# Patient Record
Sex: Female | Born: 1985 | Hispanic: No | Marital: Single | Smoking: Current every day smoker
Health system: Southern US, Community
[De-identification: ages and names within clinical notes are randomized; demographics above are authoritative.]

## PROBLEM LIST (undated history)

## (undated) DIAGNOSIS — K219 Gastro-esophageal reflux disease without esophagitis: Secondary | ICD-10-CM

## (undated) DIAGNOSIS — A048 Other specified bacterial intestinal infections: Secondary | ICD-10-CM

## (undated) DIAGNOSIS — K279 Peptic ulcer, site unspecified, unspecified as acute or chronic, without hemorrhage or perforation: Secondary | ICD-10-CM

## (undated) DIAGNOSIS — B9681 Helicobacter pylori [H. pylori] as the cause of diseases classified elsewhere: Secondary | ICD-10-CM

## (undated) HISTORY — DX: Gastro-esophageal reflux disease without esophagitis: K21.9

## (undated) HISTORY — PX: CHOLECYSTECTOMY: SHX55

---

## 2006-03-12 ENCOUNTER — Emergency Department (HOSPITAL_COMMUNITY): Admission: EM | Admit: 2006-03-12 | Discharge: 2006-03-12 | Payer: Self-pay | Admitting: Emergency Medicine

## 2006-04-23 ENCOUNTER — Emergency Department (HOSPITAL_COMMUNITY): Admission: EM | Admit: 2006-04-23 | Discharge: 2006-04-24 | Payer: Self-pay | Admitting: Emergency Medicine

## 2006-08-31 ENCOUNTER — Inpatient Hospital Stay (HOSPITAL_COMMUNITY): Admission: AD | Admit: 2006-08-31 | Discharge: 2006-08-31 | Payer: Self-pay | Admitting: Obstetrics & Gynecology

## 2006-11-18 ENCOUNTER — Inpatient Hospital Stay (HOSPITAL_COMMUNITY): Admission: AD | Admit: 2006-11-18 | Discharge: 2006-11-18 | Payer: Self-pay | Admitting: Obstetrics & Gynecology

## 2006-11-25 ENCOUNTER — Inpatient Hospital Stay (HOSPITAL_COMMUNITY): Admission: AD | Admit: 2006-11-25 | Discharge: 2006-11-25 | Payer: Self-pay | Admitting: Obstetrics & Gynecology

## 2006-12-05 ENCOUNTER — Ambulatory Visit: Payer: Self-pay | Admitting: Obstetrics and Gynecology

## 2006-12-05 ENCOUNTER — Inpatient Hospital Stay (HOSPITAL_COMMUNITY): Admission: AD | Admit: 2006-12-05 | Discharge: 2006-12-10 | Payer: Self-pay | Admitting: Obstetrics & Gynecology

## 2007-01-10 ENCOUNTER — Emergency Department (HOSPITAL_COMMUNITY): Admission: EM | Admit: 2007-01-10 | Discharge: 2007-01-11 | Payer: Self-pay | Admitting: Emergency Medicine

## 2007-02-06 ENCOUNTER — Emergency Department (HOSPITAL_COMMUNITY): Admission: EM | Admit: 2007-02-06 | Discharge: 2007-02-06 | Payer: Self-pay | Admitting: Emergency Medicine

## 2007-06-06 ENCOUNTER — Emergency Department (HOSPITAL_COMMUNITY): Admission: EM | Admit: 2007-06-06 | Discharge: 2007-06-06 | Payer: Self-pay | Admitting: Emergency Medicine

## 2007-06-14 ENCOUNTER — Encounter
Admission: RE | Admit: 2007-06-14 | Discharge: 2007-07-21 | Payer: Self-pay | Admitting: Physical Medicine and Rehabilitation

## 2007-07-01 ENCOUNTER — Emergency Department (HOSPITAL_COMMUNITY): Admission: EM | Admit: 2007-07-01 | Discharge: 2007-07-02 | Payer: Self-pay | Admitting: Emergency Medicine

## 2007-09-06 ENCOUNTER — Emergency Department (HOSPITAL_COMMUNITY): Admission: EM | Admit: 2007-09-06 | Discharge: 2007-09-07 | Payer: Self-pay | Admitting: Emergency Medicine

## 2007-09-22 ENCOUNTER — Encounter (INDEPENDENT_AMBULATORY_CARE_PROVIDER_SITE_OTHER): Payer: Self-pay | Admitting: General Surgery

## 2007-09-22 ENCOUNTER — Ambulatory Visit (HOSPITAL_COMMUNITY): Admission: RE | Admit: 2007-09-22 | Discharge: 2007-09-22 | Payer: Self-pay | Admitting: General Surgery

## 2007-10-06 ENCOUNTER — Encounter: Admission: RE | Admit: 2007-10-06 | Discharge: 2007-10-06 | Payer: Self-pay | Admitting: General Surgery

## 2007-11-05 ENCOUNTER — Emergency Department (HOSPITAL_COMMUNITY): Admission: EM | Admit: 2007-11-05 | Discharge: 2007-11-05 | Payer: Self-pay | Admitting: Emergency Medicine

## 2007-11-26 ENCOUNTER — Encounter: Admission: RE | Admit: 2007-11-26 | Discharge: 2007-11-26 | Payer: Self-pay | Admitting: Family Medicine

## 2008-02-19 ENCOUNTER — Emergency Department (HOSPITAL_COMMUNITY): Admission: EM | Admit: 2008-02-19 | Discharge: 2008-02-19 | Payer: Self-pay | Admitting: Emergency Medicine

## 2008-04-20 ENCOUNTER — Emergency Department (HOSPITAL_COMMUNITY): Admission: EM | Admit: 2008-04-20 | Discharge: 2008-04-20 | Payer: Self-pay | Admitting: Emergency Medicine

## 2008-04-29 ENCOUNTER — Emergency Department (HOSPITAL_COMMUNITY): Admission: EM | Admit: 2008-04-29 | Discharge: 2008-04-29 | Payer: Self-pay | Admitting: Emergency Medicine

## 2008-05-25 ENCOUNTER — Emergency Department (HOSPITAL_COMMUNITY): Admission: EM | Admit: 2008-05-25 | Discharge: 2008-05-25 | Payer: Self-pay | Admitting: Emergency Medicine

## 2008-05-31 ENCOUNTER — Emergency Department (HOSPITAL_COMMUNITY): Admission: EM | Admit: 2008-05-31 | Discharge: 2008-06-01 | Payer: Self-pay | Admitting: Emergency Medicine

## 2008-06-22 ENCOUNTER — Emergency Department (HOSPITAL_COMMUNITY): Admission: EM | Admit: 2008-06-22 | Discharge: 2008-06-22 | Payer: Self-pay | Admitting: Emergency Medicine

## 2008-11-14 ENCOUNTER — Emergency Department (HOSPITAL_COMMUNITY): Admission: EM | Admit: 2008-11-14 | Discharge: 2008-11-14 | Payer: Self-pay | Admitting: Emergency Medicine

## 2008-11-23 ENCOUNTER — Emergency Department (HOSPITAL_COMMUNITY): Admission: EM | Admit: 2008-11-23 | Discharge: 2008-11-23 | Payer: Self-pay | Admitting: Emergency Medicine

## 2009-05-18 ENCOUNTER — Emergency Department (HOSPITAL_COMMUNITY): Admission: EM | Admit: 2009-05-18 | Discharge: 2009-05-18 | Payer: Self-pay | Admitting: Emergency Medicine

## 2009-05-29 ENCOUNTER — Emergency Department (HOSPITAL_COMMUNITY): Admission: EM | Admit: 2009-05-29 | Discharge: 2009-05-29 | Payer: Self-pay | Admitting: Emergency Medicine

## 2009-11-18 ENCOUNTER — Emergency Department (HOSPITAL_COMMUNITY): Admission: EM | Admit: 2009-11-18 | Discharge: 2009-11-18 | Payer: Self-pay | Admitting: Emergency Medicine

## 2010-02-24 ENCOUNTER — Emergency Department (HOSPITAL_COMMUNITY): Admission: EM | Admit: 2010-02-24 | Discharge: 2010-02-24 | Payer: Self-pay | Admitting: Emergency Medicine

## 2010-09-26 ENCOUNTER — Emergency Department (HOSPITAL_COMMUNITY)
Admission: EM | Admit: 2010-09-26 | Discharge: 2010-09-26 | Disposition: A | Discharge status: Home / Self Care | Payer: Medicaid Other | Attending: Emergency Medicine | Admitting: Emergency Medicine

## 2010-09-26 ENCOUNTER — Emergency Department (HOSPITAL_COMMUNITY): Payer: Medicaid Other

## 2010-09-26 DIAGNOSIS — R0602 Shortness of breath: Secondary | ICD-10-CM | POA: Insufficient documentation

## 2010-09-26 DIAGNOSIS — R079 Chest pain, unspecified: Secondary | ICD-10-CM | POA: Insufficient documentation

## 2010-11-17 LAB — WET PREP, GENITAL
Clue Cells Wet Prep HPF POC: NONE SEEN
Trich, Wet Prep: NONE SEEN
Yeast Wet Prep HPF POC: NONE SEEN

## 2010-11-17 LAB — URINALYSIS, ROUTINE W REFLEX MICROSCOPIC
Bilirubin Urine: NEGATIVE
Ketones, ur: NEGATIVE mg/dL
Leukocytes, UA: NEGATIVE
Nitrite: NEGATIVE
Protein, ur: NEGATIVE mg/dL
Urobilinogen, UA: 0.2 mg/dL (ref 0.0–1.0)
pH: 5.5 (ref 5.0–8.0)

## 2010-11-17 LAB — URINE MICROSCOPIC-ADD ON

## 2010-11-17 LAB — GC/CHLAMYDIA PROBE AMP, GENITAL
Chlamydia, DNA Probe: NEGATIVE
GC Probe Amp, Genital: NEGATIVE

## 2011-01-07 NOTE — Consult Note (Signed)
NAMEGENEVA, BARRERO NO.:  [id number]   MEDICAL RECORD NO.:  [id number]          PATIENT TYPE:  EMS   LOCATION:  ED                           FACILITY:  [facility]   PHYSICIAN:  Lennie Muckle, MD      DATE OF BIRTH:  Jan 06, 1986   DATE OF CONSULTATION:  09/06/2007  DATE OF DISCHARGE:  09/07/2007                                 CONSULTATION   CHIEF COMPLAINT:  Abdominal  pain.   Ms. Dizon is a 25 year old female who I have seen in office due to  symptomatic cholelithiasis.  Apparently the past 24 hours she has had an  increase in abdominal pain and last night had nausea and vomiting.  She  states her pain continued today and she has not been able to keep  anything down.  She was scheduled for surgery at the end of the month.   PAST MEDICAL HISTORY:  None.   PAST SURGICAL HISTORY:  None.   SOCIAL HISTORY:  She smokes cigarettes.   ALLERGIES:  NO KNOWN DRUG ALLERGIES.   MEDICATIONS:  Takes no medications.   REVIEW OF SYSTEMS:  Twelve point system is negative other than the HPI.   PHYSICAL EXAMINATION:  GENERAL: She is a pleasant female lying on a  stretcher in no acute distress.  VITAL SIGNS: Her temperature is 98.2, blood pressure 125/91, pulse is  78.  ABDOMEN: Slightly tender to palpation right upper quadrant.  No  peritoneal signs are noted.   LABORATORY:  Reviewed. She does have some liver enzyme elevation of 110  and 121 of AST and ALT respectively. Alkaline phosphatase is normal at  80.  Bilirubin is 1.2. White count is mildly elevated at 15.2.  Urinalysis is essentially without problems.   ASSESSMENT AND PLAN:  Biliary colic.  I think Ms. Kendle is not in  emergent need for cholecystectomy at the present time.  Hopefully she  will be able to get her pain under control with IV narcotics and receive  IV fluids for rehydration.  She should be able to be discharged home  from the emergency room and will follow up with her surgery in the near  future.      Lennie Muckle, MD  Electronically Signed    ALA/MEDQ  D:  09/06/2007  T:  09/07/2007  Job:  [id number]

## 2011-01-07 NOTE — Op Note (Signed)
[name], [name] NO.:  [id number]   MEDICAL RECORD NO.:  [id number]          PATIENT TYPE:  AMB   LOCATION:  SDS                          FACILITY:  [facility]   PHYSICIAN:  Lennie Muckle, MD      DATE OF BIRTH:  Aug 16, 1986   DATE OF PROCEDURE:  09/22/2007  DATE OF DISCHARGE:  09/22/2007                               OPERATIVE REPORT   PREOPERATIVE DIAGNOSIS:  Symptomatic cholelithiasis.   POSTOPERATIVE DIAGNOSIS:  Symptomatic cholelithiasis.   PROCEDURE:  Laparoscopic cholecystectomy with intraoperative  cholangiogram.   SURGEON:  Amber L. Freida Busman, MD.   ASSISTANT:  Currie Paris, MD.   ANESTHESIA:  General endotracheal anesthesia.   FINDINGS:  Intrahepatic gallbladder, no common bile duct obstruction.   ESTIMATED BLOOD LOSS:  Less than 25 ml.   No immediate complications.  No drains were placed.   INDICATIONS FOR PROCEDURE:  [name] is a 25 year old female, who  had had multiple episodes of right upper quadrant pain.  Ultrasound  revealed cholelithiasis.  She did have a slight rise in her liver  enzymes previously and did not have any common hepatic duct dilation.  Informed consent was obtained for a laparoscopic cholecystectomy with  cholangiogram.   DETAILS OF PROCEDURE:  [name] was identified in the preoperative  holding suite.  She was then taken to the operating room, placed in a  supine position, administration of cefoxitin preoperatively was given.  Once in the operating room, she was placed in a supine position and  placed under general endotracheal anesthesia.  Her abdomen was prepped  and draped in the usual sterile fashion.  A timeout procedure indicating  the patient and the procedure was performed.  An incision was placed  just above the umbilicus, the fascia was grasped with a Kocher, a Veress  needle placed into the abdominal cavity to obtain a pneumoperitoneum.  Using a #11-mm trocar and the Optiview, the camera was  introduced into  the abdominal cavity.  There was no evidence of injury with the  placement of the Veress needle or the trocar.  A 5-mm trocar was placed  in the epigastric region.  Two 5-mm trocars were placed along the right  costochondral margin, and on visualization with the camera was then  placed in the gallbladder fossa.  There was a small, whitish  irregularity at the superior portion of the liver bed.  The fundus of  the gallbladder was retracted up towards the head of the patient.  The  infundibulum was grasped away from the liver bed.  Using the [place]  forceps, the peritoneum surrounding the gallbladder inferiorly was  dissected to expose the cystic duct.  The full exposure of the cystic  duct/cystic artery to obtain a critical view of the cystic duct artery  and liver bed was obtained.  The 5-mm trocar at the epigastric region  was then up-sized to an 11-mm trocar in order to place a 10-mm clip on  the distal portion of the cystic duct.  This was then partially  transected with laparoscopic scissors.  The cholangiogram catheter was  then placed into the cystic duct, and the cholangiogram was performed.  There did seem to be an air bubble at the distal portion of the common  bile duct, but no obstruction.  Contrast flowed freely into the duodenum  and into the right and left hepatic ducts.  Three clips were placed  proximally, and the cystic duct was then transected, two clips were  placed proximally on the cystic artery and one distally and was also  transected with laparoscopic scissors.  The peritoneum attachments to  the gallbladder were then taken with the electrocautery.  The fundus of  the gallbladder extended into the liver bed, which was the widest nodule  which was seen at the beginning of the case.  I was able to successfully  remove the gallbladder from the liver bed.  Hemostasis was maintained  with the electrocautery.  Surgicel was placed within the liver bed.   Upon the final inspection, there was no evidence of bleeding from the  liver bed.  The abdomen was irrigated with two liters of saline due a  small spillage of bile during the procedure.  The fluid was clear on  final irrigation.  The gallbladder was placed in an EndoCatch bag and  removed from the infraumbilical incision.  The fascia at the  supraumbilical incision was closed with a #0 Vicryl suture.  The skin  was closed with #4-0 Monocryl.  Trocars were removed and all ports were  closed with #4-0 Monocryl.  Steri-Strips were placed with the final  dressing.  The patient was then extubated and transported to the  postanesthesia care unit in stable condition.  She will follow up with  me in approximately two weeks time.      Lennie Muckle, MD  Electronically Signed     ALA/MEDQ  D:  09/22/2007  T:  09/22/2007  Job:  [id number]

## 2011-01-10 NOTE — Discharge Summary (Signed)
[name], [name] NO.:  [id number]   MEDICAL RECORD NO.:  [id number]          PATIENT TYPE:  INP   LOCATION:  9107                          FACILITY:  [facility]   PHYSICIAN:  Randye Lobo, M.D.   DATE OF BIRTH:  [birth year]   DATE OF ADMISSION:  12/05/2006  DATE OF DISCHARGE:  12/10/2006                               DISCHARGE SUMMARY   FINAL DIAGNOSIS:  1. Intrauterine pregnancy at term.  2. Labor.  3. Cephalopelvic disproportion.   POSTOPERATIVE DIAGNOSIS:  Endometritis.   PROCEDURE:  Primary low transverse cesarean section.  Surgeon Dr.  Ilda Mori.  Assistant Dr. Christin Bach.   COMPLICATIONS:  None.   HISTORY OF PRESENT ILLNESS:  This 25 year old G1 P0 presents at term in  early labor.  The patient's antepartum course had been uncomplicated up  until this last couple weeks.  The patient was noted to have an AFI in  the range of 6-7. Nonstress test was reactive and the patient's BPP was  8/8.  Otherwise her antepartum course had been uncomplicated.  She was  admitted at this point at term.  She had a dysfunctional latent phase of  labor and therefore a low dose IV Pitocin was begun.  The patient  steadily progressed to complete and complete. She pushed for an hour and  half with very little descent of the vertex. Cephalopelvic disproportion  was diagnosed at this point with arrest of the descent in second stage  labor and at this point the patient was taken to the operating room on  the day of December 06, 2006 by Dr. Ilda Mori where a primary low  transverse cesarean section was performed with the delivery of an 8  pounds 8 ounces female infant with Apgars of nine and nine. The baby was  in the occiput posterior position. Delivery went without complications.  There was some excessive bleeding in the lower uterine segment. The  lower uterine segment was closed in two layers with interlocking  sutures. The bleeding point were identified and closed  with figure-of-  eight sutures and vertical mattress suture until hemostasis was  obtained. The rest of the procedure without complication.  The patient's  postoperative course was complicated by some postoperative anemia and  postoperative fevers.  The patient was diagnosed with probable  endometritis. On postoperative day #3 was started on some IV Unasyn. The  patient's temperatures did resolve by postoperative day #4, she was  feeling much better and had no significant fevers over last 24 hours and  was felt ready for discharge.  She was sent home on a regular diet, told  to continue her vitamins and iron supplement twice daily, was given  Percocet one to two every 4-6 hours as needed for pain, was given a  prescription for Augmentin to take as directed for another week and was  to follow up in our office in 1 week to recheck. Of course to call with  any increased temperature, bleeding or pain.   LABS ON DISCHARGE:  The patient had a hemoglobin of 7.4, white blood  cell count of  19.2 and platelets of 354,000.      [name], P.A.-C.      Randye Lobo, M.D.  Electronically Signed    MB/MEDQ  D:  01/15/2007  T:  01/15/2007  Job:  [id number]

## 2011-01-10 NOTE — Op Note (Signed)
[name], REINERTSEN NO.:  [id number]   MEDICAL RECORD NO.:  [id number]          PATIENT TYPE:  INP   LOCATION:  9107                          FACILITY:  [facility]   PHYSICIAN:  Ilda Mori, M.D.   DATE OF BIRTH:  06-Jul-1986   DATE OF PROCEDURE:  12/06/2006  DATE OF DISCHARGE:                               OPERATIVE REPORT   PREOPERATIVE DIAGNOSIS:  Cephalopelvic disproportion.   POSTOPERATIVE DIAGNOSIS:  Cephalopelvic disproportion.   PROCEDURE:  Primary low transverse cesarean section.   SURGEON:  Ilda Mori, M.D.   ASSISTANT:  Tilda Burrow, M.D.   ANESTHESIA:  Epidural.   FINDINGS:  A female infant with Apgar scores of 9 and 9,  birth weight 8  pounds 8 ounces.  The tubes and ovaries appeared normal.  The baby was  in the occiput posterior position.   COMPLICATIONS:  Excessive bleeding from the lower uterine segment   INDICATIONS:  This is a 25 year old primigravid female who presented to  [facility] in early labor.  The patient's estimated fetal weight on  ultrasound, prior to admission, was 7 pounds 8 ounces.  The patient had  a long labor complicated by poor quality uterine contractions which did  not respond well to IV Pitocin or IV Inderal.  The patient did, however,  progress to complete dilatation.  She pushed for 1-1/2 hours with very  little descent the vertex.  The decision was made, at that point, to  proceed with cesarean section for arrest of decent in the second stage  of labor.   PROCEDURE:  The patient was taken to the operating room; and the  epidural anesthesia that had been used for labor was injected for  surgical anesthesia.  The abdomen was prepped and draped in a sterile fashion.  The bladder  had previously been catheterized.  A low-transverse incision was made  and carried down to the fascia which was entered transversely.  The  rectus muscles were divided from the overlying rectus sheath and divided  in the  midline.  The peritoneum was identified, entered sharply, and  extended bluntly with the lower uterine segment identified; an incision  was made, and carried down into the intrauterine cavity; and this  incision was extended bluntly.  The infant was then delivered without  difficulty; and the cord bloods were obtained.  The placenta was then  delivered with traction on the cord; and the uterus was bluntly  curettaged.  Due to heavy bleeding from the angles of the lower segment  incision, the uterus was delivered from the peritoneal cavity to aid in  exposure.  The lower segment was then closed in two layers of  interlocking Vicryl suture.  Bleeding points at the angles were then  identified and closed with a figure-of-eight sutures and vertical  mattress suture until hemostasis was obtained.  At this point the uterus  was then replaced into the peritoneal cavity.  The uterine incision was  dry.  The peritoneum was closed with running 3-0 Vicryl suture.  The  rectus muscle  was reapposed in the midline with interrupted 3-0  Vicryl suture.  The  fascia was closed with running #0 Vicryl suture.  Three subcutaneous  sutures were placed of 2-0 plain catgut and then the skin was closed  with staples.  The patient tolerated the procedure well, and left the  operating room in good condition.      Ilda Mori, M.D.  Electronically Signed     RK/MEDQ  D:  12/06/2006  T:  12/06/2006  Job:  [id number]

## 2011-05-15 LAB — DIFFERENTIAL
Basophils Absolute: 0.2 — ABNORMAL HIGH
Basophils Relative: 1
Eosinophils Absolute: 0
Eosinophils Relative: 0
Lymphocytes Relative: 19
Lymphs Abs: 2.8
Monocytes Absolute: 0.8
Monocytes Relative: 5
Neutro Abs: 11.4 — ABNORMAL HIGH
Neutrophils Relative %: 75

## 2011-05-15 LAB — COMPREHENSIVE METABOLIC PANEL
ALT: 121 — ABNORMAL HIGH
AST: 110 — ABNORMAL HIGH
Albumin: 4
CO2: 29
Chloride: 101
GFR calc Af Amer: >60
GFR calc non Af Amer: >60
Sodium: 136
Total Bilirubin: 1.2

## 2011-05-15 LAB — URINALYSIS, ROUTINE W REFLEX MICROSCOPIC
Bilirubin Urine: NEGATIVE
Ketones, ur: NEGATIVE
Nitrite: NEGATIVE
Protein, ur: 30 — AB
Specific Gravity, Urine: 1.025
Urobilinogen, UA: 0.2
pH: 8.5 — ABNORMAL HIGH

## 2011-05-15 LAB — CBC
HCT: 37.8
HCT: 38.1
Hemoglobin: 12.9
Hemoglobin: 12.7
MCHC: 34.1
MCHC: 33.2
MCV: 79.9
Platelets: 429 — ABNORMAL HIGH
Platelets: 376
RBC: 4.72
RDW: 14
RDW: 13.7
WBC: 15.2 — ABNORMAL HIGH

## 2011-05-15 LAB — COMPREHENSIVE METABOLIC PANEL WITH GFR
Alkaline Phosphatase: 80
BUN: 7
Calcium: 9.2
Creatinine, Ser: 0.76
Glucose, Bld: 107 — ABNORMAL HIGH
Potassium: 4.1
Total Protein: 7.9

## 2011-05-15 LAB — LIPASE, BLOOD: Lipase: 29

## 2011-05-15 LAB — URINE MICROSCOPIC-ADD ON

## 2011-05-15 LAB — HEPATIC FUNCTION PANEL
AST: 16
Bilirubin, Direct: <0.1
Total Bilirubin: 0.4

## 2011-05-15 LAB — PREGNANCY, URINE: Preg Test, Ur: NEGATIVE

## 2011-05-16 ENCOUNTER — Emergency Department (HOSPITAL_COMMUNITY)
Admission: EM | Admit: 2011-05-16 | Discharge: 2011-05-16 | Disposition: A | Discharge status: Home / Self Care | Payer: Medicaid Other | Attending: Emergency Medicine | Admitting: Emergency Medicine

## 2011-05-16 DIAGNOSIS — R51 Headache: Secondary | ICD-10-CM | POA: Insufficient documentation

## 2011-05-16 DIAGNOSIS — R112 Nausea with vomiting, unspecified: Secondary | ICD-10-CM | POA: Insufficient documentation

## 2011-05-19 LAB — URINALYSIS, ROUTINE W REFLEX MICROSCOPIC
Nitrite: NEGATIVE
Protein, ur: NEGATIVE
Specific Gravity, Urine: 1.024
Urobilinogen, UA: 0.2

## 2011-05-19 LAB — POCT PREGNANCY, URINE
Operator id: 102851
Preg Test, Ur: NEGATIVE

## 2011-05-22 LAB — BASIC METABOLIC PANEL
BUN: 8
Calcium: 8.8
Creatinine, Ser: 0.61
GFR calc non Af Amer: >60
Glucose, Bld: 100 — ABNORMAL HIGH
Potassium: 3.9

## 2011-05-22 LAB — CBC
HCT: 37.1
Platelets: 293
RDW: 14.3
WBC: 8.4

## 2011-05-22 LAB — DIFFERENTIAL
Basophils Absolute: 0.1
Eosinophils Relative: 0
Lymphocytes Relative: 14
Neutrophils Relative %: 79 — ABNORMAL HIGH

## 2011-05-26 LAB — URINALYSIS, ROUTINE W REFLEX MICROSCOPIC
Ketones, ur: NEGATIVE
Nitrite: NEGATIVE
Protein, ur: NEGATIVE
pH: 6

## 2011-05-26 LAB — LIPASE, BLOOD: Lipase: 23

## 2011-05-26 LAB — COMPREHENSIVE METABOLIC PANEL
Albumin: 4.2
BUN: 10
Calcium: 9.7
Glucose, Bld: 99
Sodium: 141
Total Protein: 7.5

## 2011-05-26 LAB — PREGNANCY, URINE: Preg Test, Ur: NEGATIVE

## 2011-05-26 LAB — D-DIMER, QUANTITATIVE: D-Dimer, Quant: 0.29

## 2011-06-03 LAB — DIFFERENTIAL
Eosinophils Absolute: 0
Lymphs Abs: 1.6
Monocytes Relative: 5
Neutrophils Relative %: 85 — ABNORMAL HIGH

## 2011-06-03 LAB — URINALYSIS, ROUTINE W REFLEX MICROSCOPIC
Nitrite: NEGATIVE
Specific Gravity, Urine: 1.035 — ABNORMAL HIGH
Urobilinogen, UA: 1

## 2011-06-03 LAB — CBC
Hemoglobin: 12.6
MCHC: 33.4
Platelets: 457 — ABNORMAL HIGH
RDW: 14.8 — ABNORMAL HIGH

## 2011-06-03 LAB — URINE MICROSCOPIC-ADD ON

## 2011-06-03 LAB — COMPREHENSIVE METABOLIC PANEL
ALT: 161 — ABNORMAL HIGH
Calcium: 9.6
GFR calc Af Amer: >60
Glucose, Bld: 118 — ABNORMAL HIGH
Sodium: 139
Total Protein: 7.7

## 2011-06-05 ENCOUNTER — Emergency Department (HOSPITAL_COMMUNITY): Payer: Medicaid Other

## 2011-06-05 ENCOUNTER — Emergency Department (HOSPITAL_COMMUNITY)
Admission: EM | Admit: 2011-06-05 | Discharge: 2011-06-06 | Disposition: A | Discharge status: Home / Self Care | Payer: Medicaid Other | Attending: Emergency Medicine | Admitting: Emergency Medicine

## 2011-06-05 DIAGNOSIS — A499 Bacterial infection, unspecified: Secondary | ICD-10-CM | POA: Insufficient documentation

## 2011-06-05 DIAGNOSIS — N760 Acute vaginitis: Secondary | ICD-10-CM | POA: Insufficient documentation

## 2011-06-05 DIAGNOSIS — N898 Other specified noninflammatory disorders of vagina: Secondary | ICD-10-CM | POA: Insufficient documentation

## 2011-06-05 DIAGNOSIS — B9689 Other specified bacterial agents as the cause of diseases classified elsewhere: Secondary | ICD-10-CM | POA: Insufficient documentation

## 2011-06-05 LAB — URINALYSIS, ROUTINE W REFLEX MICROSCOPIC
Glucose, UA: NEGATIVE
Ketones, ur: NEGATIVE
Nitrite: NEGATIVE
Specific Gravity, Urine: 1.021 (ref 1.005–1.030)
Urobilinogen, UA: 0.2 mg/dL (ref 0.0–1.0)
pH: 7

## 2011-06-05 LAB — POCT PREGNANCY, URINE: Preg Test, Ur: NEGATIVE

## 2011-06-05 LAB — WET PREP, GENITAL: Yeast Wet Prep HPF POC: NONE SEEN

## 2011-06-05 LAB — URINE MICROSCOPIC-ADD ON

## 2011-06-06 LAB — RPR: RPR Ser Ql: NONREACTIVE

## 2011-07-11 ENCOUNTER — Emergency Department (HOSPITAL_COMMUNITY)
Admission: EM | Admit: 2011-07-11 | Discharge: 2011-07-11 | Disposition: A | Discharge status: Home / Self Care | Payer: Medicaid Other | Source: Home / Self Care | Attending: Emergency Medicine | Admitting: Emergency Medicine

## 2011-07-11 DIAGNOSIS — R6889 Other general symptoms and signs: Secondary | ICD-10-CM

## 2011-07-11 MED ORDER — FLUTICASONE PROPIONATE 50 MCG/ACT NA SUSP: 2.0000 | Freq: Every day | NASAL | Status: DC

## 2011-07-11 MED ORDER — PSEUDOEPHEDRINE-GUAIFENESIN ER 120-1200 MG PO TB12: 1.0000 | ORAL_TABLET | Freq: Two times a day (BID) | ORAL | Status: DC

## 2011-07-11 MED ORDER — IBUPROFEN 600 MG PO TABS: 600.0000 mg | ORAL_TABLET | Freq: Four times a day (QID) | ORAL | Status: AC | PRN

## 2011-07-11 NOTE — ED Provider Notes (Signed)
History     CSN: [id number] Arrival date & time: 07/11/2011 11:39 AM   First MD Initiated Contact with Patient 07/11/11 1007      Chief Complaint  Patient presents with  . Cough    (Consider location/radiation/quality/duration/timing/severity/associated sxs/prior treatment) HPI Comments: Nasal congestion, nasal d/c, bodyaches, malaise, decreased appetite, rhinorrhea, postnasal drip, ST, nonproductive cough x 2 days. No hemoptysis, fevers, CP SOB, wheezing, abd pain. N/V, diarrhea.   Patient is a 25 y.o. female presenting with cough. The history is provided by the patient.  Cough This is a new problem. The current episode started 2 days ago. The problem occurs constantly. The cough is non-productive. There has been no fever. Associated symptoms include ear congestion, rhinorrhea, sore throat and myalgias. Pertinent negatives include no chest pain, no chills, no sweats, no ear pain, no headaches, no shortness of breath, no wheezing and no eye redness. Treatments tried: otc cold/flu medication. The treatment provided mild relief. She is a smoker. Her past medical history does not include bronchitis, pneumonia or asthma.    History reviewed. No pertinent past medical history.  Past Surgical History  Procedure Date  . Cholecystectomy   . Cesarean section     History reviewed. No pertinent family history.  History  Substance Use Topics  . Smoking status: Current Everyday Smoker -- 0.5 packs/day    Types: Cigarettes  . Smokeless tobacco: Not on file  . Alcohol Use: Yes    OB History    Grav Para Term Preterm Abortions TAB SAB Ect Mult Living                  Review of Systems  Constitutional: Negative for fever and chills.  HENT: Positive for sore throat, rhinorrhea and postnasal drip. Negative for ear pain, trouble swallowing and voice change.   Eyes: Negative for redness.  Respiratory: Positive for cough. Negative for chest tightness, shortness of breath and wheezing.     Cardiovascular: Negative for chest pain.  Gastrointestinal: Negative for nausea, vomiting, abdominal pain and diarrhea.  Musculoskeletal: Positive for myalgias.  Skin: Negative for rash.  Neurological: Negative for headaches.    Allergies  Review of patient's allergies indicates no known allergies.  Home Medications   Current Outpatient Rx  Name Route Sig Dispense Refill  . FLUTICASONE PROPIONATE 50 MCG/ACT NA SUSP Nasal Place 2 sprays into the nose daily. 16 g 2  . IBUPROFEN 600 MG PO TABS Oral Take 1 tablet (600 mg total) by mouth every 6 (six) hours as needed for pain. 30 tablet 0  . PSEUDOEPHEDRINE-GUAIFENESIN [id number] MG PO TB12 Oral Take 1 tablet by mouth 2 (two) times daily. 20 each 0    BP 134/75  Pulse 71  Temp(Src) 98.2 F (36.8 C) (Oral)  Resp 16  SpO2 100%  LMP 06/20/2011  Physical Exam  Nursing note and vitals reviewed. Constitutional: She is oriented to person, place, and time. She appears well-developed and well-nourished. No distress.  HENT:  Head: Normocephalic and atraumatic.  Right Ear: Ear canal normal. No tenderness. Tympanic membrane is bulging. Tympanic membrane is not erythematous. A middle ear effusion is present.  Left Ear: Ear canal normal. Tympanic membrane is retracted.  Nose: Mucosal edema and rhinorrhea present. Right sinus exhibits no maxillary sinus tenderness and no frontal sinus tenderness. Left sinus exhibits no maxillary sinus tenderness and no frontal sinus tenderness.  Mouth/Throat: Uvula is midline and mucous membranes are normal.       Serous effusion R ear  Eyes:  EOM are normal. Pupils are equal, round, and reactive to light.  Neck: Normal range of motion.  Cardiovascular: Normal rate, regular rhythm, normal heart sounds and intact distal pulses.   Pulmonary/Chest: Effort normal and breath sounds normal. No respiratory distress. She has no wheezes. She has no rales.  Abdominal: She exhibits no distension.  Musculoskeletal: Normal  range of motion. She exhibits no edema and no tenderness.  Lymphadenopathy:    She has no cervical adenopathy.  Neurological: She is alert and oriented to person, place, and time.  Skin: Skin is warm and dry. No rash noted.  Psychiatric: She has a normal mood and affect. Her behavior is normal. Judgment and thought content normal.    ED Course  Procedures (including critical care time)  Labs Reviewed - No data to display No results found.   1. Flu-like symptoms       MDM  Pt appears to be in NAD. VSS. Pt non-toxic appearing. No evidence of pharyngitis or OM. No evidence of neck stiffness or other sx to support meningitis. No evidence of dehydration. Abd S/NT/ND without peritoneal sx. Doubt intraabdominal process. No evidence of PNA or UTI. Pt able to tolerate PO. Pt with viral syndrome, probable flu. Will treat symptomatically with OTC medications and have pt f/u with PCP PRN  Luiz Blare, MD 07/11/11 1311

## 2011-07-11 NOTE — ED Notes (Signed)
C/o has been having a cough w congestion for past 2 days, w general body aches, yellow nasal secretions, feels cold all the time, "feels like the flu"

## 2011-09-21 ENCOUNTER — Encounter (HOSPITAL_COMMUNITY): Payer: Self-pay | Admitting: Emergency Medicine

## 2011-09-21 ENCOUNTER — Emergency Department (HOSPITAL_COMMUNITY)
Admission: EM | Admit: 2011-09-21 | Discharge: 2011-09-21 | Disposition: A | Discharge status: Home / Self Care | Payer: Medicaid Other | Attending: Emergency Medicine | Admitting: Emergency Medicine

## 2011-09-21 ENCOUNTER — Emergency Department (HOSPITAL_COMMUNITY): Payer: Medicaid Other

## 2011-09-21 DIAGNOSIS — F17200 Nicotine dependence, unspecified, uncomplicated: Secondary | ICD-10-CM | POA: Insufficient documentation

## 2011-09-21 DIAGNOSIS — W1809XA Striking against other object with subsequent fall, initial encounter: Secondary | ICD-10-CM | POA: Insufficient documentation

## 2011-09-21 DIAGNOSIS — R079 Chest pain, unspecified: Secondary | ICD-10-CM | POA: Insufficient documentation

## 2011-09-21 DIAGNOSIS — T148XXA Other injury of unspecified body region, initial encounter: Secondary | ICD-10-CM

## 2011-09-21 DIAGNOSIS — M549 Dorsalgia, unspecified: Secondary | ICD-10-CM | POA: Insufficient documentation

## 2011-09-21 DIAGNOSIS — R0602 Shortness of breath: Secondary | ICD-10-CM | POA: Insufficient documentation

## 2011-09-21 MED ORDER — HYDROCODONE-ACETAMINOPHEN 5-325 MG PO TABS
1.0000 | ORAL_TABLET | Freq: Once | ORAL | Status: AC
  Administered 2011-09-21: 1 via ORAL
  Filled 2011-09-21: qty 1

## 2011-09-21 MED ORDER — HYDROCODONE-ACETAMINOPHEN 5-325 MG PO TABS: 1.0000 | ORAL_TABLET | ORAL | Status: AC | PRN

## 2011-09-21 NOTE — ED Notes (Signed)
WUJ:[id number]<JY> Expected date:09/21/11<BR> Expected time: 7:55 PM<BR> Means of arrival:Ambulance<BR> Comments:<BR> EMS [facility] GC, 23 yof back pain, anxiety
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
## 2011-09-21 NOTE — ED Notes (Signed)
Pt. Injured left side of back while putting wood in a stove.  Went to work today and did some lifting then developed chest pain, facial tingling, left leg pain.  Pt. Is hyperventilating.

## 2011-09-22 NOTE — ED Provider Notes (Signed)
Medical screening examination/treatment/procedure(s) were performed by non-physician practitioner and as supervising physician I was immediately available for consultation/collaboration.  Cyndra Numbers, MD 09/22/11 1239

## 2012-04-22 DIAGNOSIS — Z720 Tobacco use: Secondary | ICD-10-CM | POA: Insufficient documentation

## 2012-05-19 ENCOUNTER — Emergency Department (HOSPITAL_COMMUNITY): Payer: Medicaid Other

## 2012-05-19 ENCOUNTER — Emergency Department (HOSPITAL_COMMUNITY)
Admission: EM | Admit: 2012-05-19 | Discharge: 2012-05-20 | Disposition: A | Discharge status: Home / Self Care | Payer: Medicaid Other | Attending: Emergency Medicine | Admitting: Emergency Medicine

## 2012-05-19 ENCOUNTER — Encounter (HOSPITAL_COMMUNITY): Payer: Self-pay

## 2012-05-19 DIAGNOSIS — K2970 Gastritis, unspecified, without bleeding: Secondary | ICD-10-CM

## 2012-05-19 DIAGNOSIS — F17200 Nicotine dependence, unspecified, uncomplicated: Secondary | ICD-10-CM | POA: Insufficient documentation

## 2012-05-19 DIAGNOSIS — K2990 Gastroduodenitis, unspecified, without bleeding: Secondary | ICD-10-CM | POA: Insufficient documentation

## 2012-05-19 HISTORY — DX: Other specified bacterial intestinal infections: A04.8

## 2012-05-19 HISTORY — DX: Helicobacter pylori (H. pylori) as the cause of diseases classified elsewhere: B96.81

## 2012-05-19 HISTORY — DX: Peptic ulcer, site unspecified, unspecified as acute or chronic, without hemorrhage or perforation: K27.9

## 2012-05-19 LAB — COMPREHENSIVE METABOLIC PANEL
ALT: 10 U/L (ref 0–35)
AST: 13 U/L (ref 0–37)
Albumin: 4 g/dL (ref 3.5–5.2)
Calcium: 9.2 mg/dL (ref 8.4–10.5)
Creatinine, Ser: 0.8 mg/dL (ref 0.50–1.10)
GFR calc non Af Amer: >90 mL/min (ref >90)
Sodium: 136 mEq/L (ref 135–145)
Total Protein: 7.1 g/dL (ref 6.0–8.3)

## 2012-05-19 LAB — URINE MICROSCOPIC-ADD ON

## 2012-05-19 LAB — CBC WITH DIFFERENTIAL/PLATELET
Basophils Absolute: 0 10*3/uL (ref 0.0–0.1)
Basophils Relative: 0 % (ref 0–1)
Eosinophils Absolute: 0.1 10*3/uL (ref 0.0–0.7)
Eosinophils Relative: 1 % (ref 0–5)
HCT: 37.8 % (ref 36.0–46.0)
Lymphocytes Relative: 34 % (ref 12–46)
MCHC: 34.4 g/dL (ref 30.0–36.0)
MCV: 81.8 fL (ref 78.0–100.0)
Monocytes Absolute: 1 10*3/uL (ref 0.1–1.0)
Platelets: 375 10*3/uL (ref 150–400)
RDW: 13.3 % (ref 11.5–15.5)
WBC: 11.8 10*3/uL — ABNORMAL HIGH (ref 4.0–10.5)

## 2012-05-19 LAB — URINALYSIS, ROUTINE W REFLEX MICROSCOPIC
Bilirubin Urine: NEGATIVE
Glucose, UA: NEGATIVE mg/dL
Specific Gravity, Urine: 1.023 (ref 1.005–1.030)
Urobilinogen, UA: 1 mg/dL (ref 0.0–1.0)
pH: 6.5 (ref 5.0–8.0)

## 2012-05-19 LAB — PREGNANCY, URINE: Preg Test, Ur: NEGATIVE

## 2012-05-19 MED ORDER — METOCLOPRAMIDE HCL 10 MG PO TABS: 10.0000 mg | ORAL_TABLET | Freq: Four times a day (QID) | ORAL | Status: DC

## 2012-05-19 MED ORDER — METOCLOPRAMIDE HCL 5 MG/ML IJ SOLN
10.0000 mg | Freq: Once | INTRAMUSCULAR | Status: AC
  Administered 2012-05-19: 10 mg via INTRAVENOUS
  Filled 2012-05-19: qty 2

## 2012-05-19 MED ORDER — HYDROMORPHONE HCL PF 1 MG/ML IJ SOLN
1.0000 mg | Freq: Once | INTRAMUSCULAR | Status: AC
  Administered 2012-05-19: 1 mg via INTRAVENOUS
  Filled 2012-05-19: qty 1

## 2012-05-19 MED ORDER — PANTOPRAZOLE SODIUM 40 MG IV SOLR
40.0000 mg | Freq: Once | INTRAVENOUS | Status: AC
  Administered 2012-05-19: 40 mg via INTRAVENOUS
  Filled 2012-05-19: qty 40

## 2012-05-19 MED ORDER — GI COCKTAIL ~~LOC~~
30.0000 mL | Freq: Once | ORAL | Status: AC
  Administered 2012-05-19: 30 mL via ORAL
  Filled 2012-05-19: qty 30

## 2012-05-19 MED ORDER — MORPHINE SULFATE 4 MG/ML IJ SOLN
4.0000 mg | Freq: Once | INTRAMUSCULAR | Status: AC
  Administered 2012-05-19: 4 mg via INTRAVENOUS
  Filled 2012-05-19: qty 1

## 2012-05-19 MED ORDER — SODIUM CHLORIDE 0.9 % IV BOLUS (SEPSIS)
1000.0000 mL | Freq: Once | INTRAVENOUS | Status: AC
  Administered 2012-05-19: 1000 mL via INTRAVENOUS

## 2012-05-19 MED ORDER — ONDANSETRON HCL 4 MG/2ML IJ SOLN
4.0000 mg | Freq: Once | INTRAMUSCULAR | Status: AC
  Administered 2012-05-19: 4 mg via INTRAVENOUS
  Filled 2012-05-19: qty 2

## 2012-05-19 MED ORDER — OXYCODONE-ACETAMINOPHEN 5-325 MG PO TABS: 1.0000 | ORAL_TABLET | ORAL | Status: DC | PRN

## 2012-05-19 NOTE — ED Provider Notes (Signed)
History     CSN: [id number]  Arrival date & time 05/19/12  4098   First MD Initiated Contact with Patient 05/19/12 2006      Chief Complaint  Patient presents with  . Chest Pain  . Abdominal Pain    (Consider location/radiation/quality/duration/timing/severity/associated sxs/prior treatment) HPI History provided by pt.   Pt reports that she was diagnosed with h.pylori and probably peptic ulcer by her PCP last week, when she presented w/ 2 days of constant and severe epigastric pain and nausea, as well as blood in her stool.  She was started on carafate, a PPI and promethazine, none of which have improved her sx.  Has an endoscopy scheduled for Monday.  Comes to ED today because now has vomiting and diarrhea and her epigastric pain has worsened.   Past abd surgeries include cholecystectomy.  Developed chest pain yesterday as well and it does not seem to be associated w/ her abd pain.  Started as pressure on the right side that started at rest and lasted for approx 45 min yesterday, and then returned at rest today, now spread across and described more as a tightness, and lasted for approx the same amount of time.  Tightness in center of chest is aggravated by deep inspiration.  Pain is not aggravated by position, movement or eating.  Denies fever, cough, SOB, LE pain/edema.  No RF for PE.  Smokes cigarettes but otherwise no RF for ACS.    Past Medical History  Diagnosis Date  . H pylori ulcer   . H. pylori infection     Past Surgical History  Procedure Date  . Cholecystectomy   . Cesarean section     No family history on file.  History  Substance Use Topics  . Smoking status: Current Every Day Smoker -- 0.5 packs/day    Types: Cigarettes  . Smokeless tobacco: Not on file  . Alcohol Use: Yes    OB History    Grav Para Term Preterm Abortions TAB SAB Ect Mult Living                  Review of Systems  All other systems reviewed and are negative.    Allergies  Review of  patient's allergies indicates no known allergies.  Home Medications   Current Outpatient Rx  Name Route Sig Dispense Refill  . AMOXICILL-CLARITHRO-LANSOPRAZ PO MISC Oral Take by mouth 2 (two) times daily. Follow package directions.    Marland Kitchen VITAMIN D 1000 UNITS PO TABS Oral Take 1,000 Units by mouth daily.    Marland Kitchen OMEPRAZOLE 20 MG PO CPDR Oral Take 20 mg by mouth daily.    Marland Kitchen PROMETHAZINE HCL 25 MG PO TABS Oral Take 25 mg by mouth every 6 (six) hours as needed. Nausea    . SUCRALFATE 1 GM/10ML PO SUSP Oral Take 1 g by mouth 4 (four) times daily.      BP 127/61  Pulse 75  Temp 98.6 F (37 C) (Oral)  Resp 13  SpO2 98%  LMP 05/14/2012  Physical Exam  Nursing note and vitals reviewed. Constitutional: She is oriented to person, place, and time. She appears well-developed and well-nourished. No distress.  HENT:  Head: Normocephalic and atraumatic.  Eyes:       Normal appearance  Neck: Normal range of motion.  Cardiovascular: Normal rate, regular rhythm and intact distal pulses.   Pulmonary/Chest: Effort normal and breath sounds normal. No respiratory distress.       Mild tenderness diffuse chest.  Pleuritic pain reported in center of chest.   Abdominal: Soft. Bowel sounds are normal. She exhibits no distension. There is no guarding.       Tenderness isolated to epigastrium  Genitourinary:       No CVA ttp  Musculoskeletal: Normal range of motion.       No peripheral edema or calf tenderness  Neurological: She is alert and oriented to person, place, and time.  Skin: Skin is warm and dry. No rash noted.  Psychiatric: She has a normal mood and affect. Her behavior is normal.    ED Course  Procedures (including critical care time)   Date: 05/19/2012  Rate: 69  Rhythm: normal sinus rhythm  QRS Axis: normal  Intervals: normal  ST/T Wave abnormalities: normal  Conduction Disutrbances:none  Narrative Interpretation:   Old EKG Reviewed: unchanged   Labs Reviewed  CBC WITH  DIFFERENTIAL - Abnormal; Notable for the following:    WBC 11.8 (*)     All other components within normal limits  URINALYSIS, ROUTINE W REFLEX MICROSCOPIC - Abnormal; Notable for the following:    Hgb urine dipstick LARGE (*)     Leukocytes, UA SMALL (*)     All other components within normal limits  URINE MICROSCOPIC-ADD ON - Abnormal; Notable for the following:    Squamous Epithelial / LPF FEW (*)     Bacteria, UA FEW (*)     All other components within normal limits  COMPREHENSIVE METABOLIC PANEL  PREGNANCY, URINE  LIPASE, BLOOD   Dg Chest 2 View  05/19/2012  *RADIOLOGY REPORT*  Clinical Data: 26 year old female with chest pain and shortness of breath.  CHEST - 2 VIEW  Comparison: 09/21/2011 and earlier.  Findings: Normal lung volumes.  Mild elevation right hemidiaphragm. Normal cardiac size and mediastinal contours.  Visualized tracheal air column is within normal limits.  The lungs are clear.  No pneumothorax or effusion. No acute osseous abnormality identified. Right upper quadrant surgical clips.  IMPRESSION: Negative, no acute cardiopulmonary abnormality.   Original Report Authenticated By: Harley Hallmark, M.D.      1. Gastritis       MDM  26yo F, recently diagnosed w/ PUD presents w/ worsening epigastric pain and N/V, refractory to PPI, carafate and promethazine, as well as new onset chest pain.  On exam, afebrile, VS w/in nml range, epigastric and diffuse chest tenderness.  No exam findings consistent with or RF for PE.  Very low risk for and poor story for ACS and EKG non-ischemic.  CXR as well as abd labs pending.  Pt to receive IV NS bolus, protonix, morphine and zofran.  Will reassess shortly.  8:57 PM   U/A positive for blood but patient has her period.  Labs otherwise unremarkable and CXR neg.  Pt reports that chest pain improved but pain in abd has worsened and she feels like she is going to throw up.  She is holding her abdomen and rocking in the bed. Will try a GI  cocktail, 1mg  dilaudid and 10mg  reglan and reassess shortly.  Based on history, exam and lab-findings, pt likely has gastritis/PUD w/ radiation of pain into chest or acid reflux.  10:04 PM   Pain and nausea improved and pt reports that she feels like she could eat now.  Passed po challenge.  D/c'd home w/ reglan to try in place of her promethazine, as well as percocet.  She will f/u with GI on Monday but return to ER if sx worsen in  meantime.  11:30 PM      Otilio Miu, [place] 05/19/12 [phone]

## 2012-05-19 NOTE — ED Notes (Signed)
Pt states dx with stomach ulcer, stomach infection and was positive for blood in stool last Wednesday and today at PCP been on lots of meds for it. States no better and now today having constant rt sided chest pain

## 2012-05-21 NOTE — ED Provider Notes (Signed)
Medical screening examination/treatment/procedure(s) were performed by non-physician practitioner and as supervising physician I was immediately available for consultation/collaboration.  Ivannia Willhelm, MD 05/21/12 1208 

## 2012-11-09 ENCOUNTER — Emergency Department (HOSPITAL_COMMUNITY)
Admission: EM | Admit: 2012-11-09 | Discharge: 2012-11-09 | Disposition: A | Discharge status: Home / Self Care | Payer: Medicaid Other | Attending: Emergency Medicine | Admitting: Emergency Medicine

## 2012-11-09 ENCOUNTER — Encounter (HOSPITAL_COMMUNITY): Payer: Self-pay | Admitting: *Deleted

## 2012-11-09 ENCOUNTER — Emergency Department (HOSPITAL_COMMUNITY): Payer: Medicaid Other

## 2012-11-09 DIAGNOSIS — Z8711 Personal history of peptic ulcer disease: Secondary | ICD-10-CM | POA: Insufficient documentation

## 2012-11-09 DIAGNOSIS — Z9089 Acquired absence of other organs: Secondary | ICD-10-CM | POA: Insufficient documentation

## 2012-11-09 DIAGNOSIS — F17200 Nicotine dependence, unspecified, uncomplicated: Secondary | ICD-10-CM | POA: Insufficient documentation

## 2012-11-09 DIAGNOSIS — N898 Other specified noninflammatory disorders of vagina: Secondary | ICD-10-CM | POA: Insufficient documentation

## 2012-11-09 DIAGNOSIS — Z8619 Personal history of other infectious and parasitic diseases: Secondary | ICD-10-CM | POA: Insufficient documentation

## 2012-11-09 DIAGNOSIS — R109 Unspecified abdominal pain: Secondary | ICD-10-CM | POA: Insufficient documentation

## 2012-11-09 DIAGNOSIS — Z3202 Encounter for pregnancy test, result negative: Secondary | ICD-10-CM | POA: Insufficient documentation

## 2012-11-09 DIAGNOSIS — M25559 Pain in unspecified hip: Secondary | ICD-10-CM | POA: Insufficient documentation

## 2012-11-09 LAB — COMPREHENSIVE METABOLIC PANEL
ALT: 17 U/L (ref 0–35)
Alkaline Phosphatase: 51 U/L (ref 39–117)
CO2: 25 mEq/L (ref 19–32)
Chloride: 105 mEq/L (ref 96–112)
GFR calc Af Amer: >90 mL/min (ref >90)
GFR calc non Af Amer: >90 mL/min (ref >90)
Glucose, Bld: 89 mg/dL (ref 70–99)
Potassium: 4.2 mEq/L (ref 3.5–5.1)
Sodium: 138 mEq/L (ref 135–145)

## 2012-11-09 LAB — URINALYSIS, ROUTINE W REFLEX MICROSCOPIC
Bilirubin Urine: NEGATIVE
Ketones, ur: NEGATIVE mg/dL
Nitrite: NEGATIVE
Urobilinogen, UA: 0.2 mg/dL (ref 0.0–1.0)
pH: 5.5 (ref 5.0–8.0)

## 2012-11-09 LAB — CBC WITH DIFFERENTIAL/PLATELET
Lymphocytes Relative: 25 % (ref 12–46)
Lymphs Abs: 2.8 10*3/uL (ref 0.7–4.0)
MCV: 85.3 fL (ref 78.0–100.0)
Neutro Abs: 7.4 10*3/uL (ref 1.7–7.7)
Neutrophils Relative %: 68 % (ref 43–77)
Platelets: 399 10*3/uL (ref 150–400)
RBC: 4.64 MIL/uL (ref 3.87–5.11)
WBC: 11 10*3/uL — ABNORMAL HIGH (ref 4.0–10.5)

## 2012-11-09 LAB — POCT PREGNANCY, URINE: Preg Test, Ur: NEGATIVE

## 2012-11-09 LAB — URINE MICROSCOPIC-ADD ON

## 2012-11-09 LAB — WET PREP, GENITAL: Clue Cells Wet Prep HPF POC: NONE SEEN

## 2012-11-09 MED ORDER — SODIUM CHLORIDE 0.9 % IV SOLN
Freq: Once | INTRAVENOUS | Status: AC
  Administered 2012-11-09: 10:00:00 via INTRAVENOUS

## 2012-11-09 MED ORDER — HYDROMORPHONE HCL PF 1 MG/ML IJ SOLN
1.0000 mg | Freq: Once | INTRAMUSCULAR | Status: AC
  Administered 2012-11-09: 1 mg via INTRAVENOUS
  Filled 2012-11-09: qty 1

## 2012-11-09 MED ORDER — IBUPROFEN 600 MG PO TABS: 600.0000 mg | ORAL_TABLET | Freq: Four times a day (QID) | ORAL | Status: DC | PRN

## 2012-11-09 MED ORDER — OXYCODONE-ACETAMINOPHEN 5-325 MG PO TABS: 1.0000 | ORAL_TABLET | Freq: Four times a day (QID) | ORAL | Status: DC | PRN

## 2012-11-09 MED ORDER — ONDANSETRON HCL 4 MG/2ML IJ SOLN
4.0000 mg | Freq: Once | INTRAMUSCULAR | Status: AC
  Administered 2012-11-09: 4 mg via INTRAVENOUS
  Filled 2012-11-09: qty 2

## 2012-11-09 NOTE — ED Provider Notes (Signed)
Medical screening examination/treatment/procedure(s) were performed by non-physician practitioner and as supervising physician I was immediately available for consultation/collaboration.    Dexton Zwilling R Tenise Stetler, MD 11/09/12 2253 

## 2012-11-09 NOTE — ED Notes (Signed)
PA student at bedside.

## 2012-11-09 NOTE — ED Provider Notes (Signed)
History     CSN: [id number]  Arrival date & time 11/09/12  7829   First MD Initiated Contact with Patient 11/09/12 [phone]      Chief Complaint  Patient presents with  . Abdominal Pain    Lower    (Consider location/radiation/quality/duration/timing/severity/associated sxs/prior treatment) HPI Comments: Patient with past medical history of cesarean section, cholecystectomy, irregular cycles s/p implanon -- presents with complaint of one week of lower abdominal cramping, vaginal bleeding that is severe at times and is not improved with over-the-counter NSAIDs. Patient states that her last was repaired was in December and lasted approximately 10 days. This was longer than normal. She's never had pain this severe with her menstrual cycle. Pain is bilateral. It radiates to the back. Patient states that discharge starts out as brown and scant. She denies sexual activity in the past 2 months. She denies fever or diarrhea. No urinary symptoms other than a foul odor to her urine. Onset of symptoms gradual. Course is constant. Nothing makes symptoms better. Movement and walking makes pain worse.   Patient is a 27 y.o. female presenting with abdominal pain. The history is provided by the patient.  Abdominal Pain Associated symptoms: vaginal bleeding and vaginal discharge   Associated symptoms: no chest pain, no cough, no diarrhea, no dysuria, no fever, no hematuria, no nausea, no sore throat and no vomiting     Past Medical History  Diagnosis Date  . H pylori ulcer   . H. pylori infection     Past Surgical History  Procedure Laterality Date  . Cholecystectomy    . Cesarean section      History reviewed. No pertinent family history.  History  Substance Use Topics  . Smoking status: Current Every Day Smoker -- 0.50 packs/day    Types: Cigarettes  . Smokeless tobacco: Never Used  . Alcohol Use: Yes    OB History   Grav Para Term Preterm Abortions TAB SAB Ect Mult Living                  Review of Systems  Constitutional: Negative for fever.  HENT: Negative for sore throat and rhinorrhea.   Eyes: Negative for redness.  Respiratory: Negative for cough.   Cardiovascular: Negative for chest pain.  Gastrointestinal: Positive for abdominal pain. Negative for nausea, vomiting and diarrhea.  Genitourinary: Positive for vaginal bleeding, vaginal discharge and pelvic pain. Negative for dysuria and hematuria.  Musculoskeletal: Negative for myalgias.  Skin: Negative for rash.  Neurological: Negative for light-headedness and headaches.    Allergies  Morphine and related  Home Medications   Current Outpatient Rx  Name  Route  Sig  Dispense  Refill  . ibuprofen (ADVIL,MOTRIN) 200 MG tablet   Oral   Take 400 mg by mouth every 8 (eight) hours as needed for pain.           BP 127/75  Pulse 73  Temp(Src) 98.6 F (37 C) (Oral)  Resp 16  SpO2 100%  LMP 10/31/2012  Physical Exam  Nursing note and vitals reviewed. Constitutional: She appears well-developed and well-nourished.  HENT:  Head: Normocephalic and atraumatic.  Eyes: Conjunctivae are normal. Right eye exhibits no discharge. Left eye exhibits no discharge.  Neck: Normal range of motion. Neck supple.  Cardiovascular: Normal rate, regular rhythm and normal heart sounds.   Pulmonary/Chest: Effort normal and breath sounds normal.  Abdominal: Soft. Bowel sounds are normal. She exhibits no distension. There is tenderness. There is no rebound, no guarding, no  CVA tenderness and no tenderness at McBurney's point.    Genitourinary: Uterus is not tender. Cervix exhibits discharge (blood). Cervix exhibits no motion tenderness. Right adnexum displays no tenderness. Left adnexum displays no tenderness. There is bleeding (Minimal amount of dark red blood in vaginal vault) around the vagina. No erythema around the vagina. No signs of injury around the vagina.  Neurological: She is alert.  Skin: Skin is warm and dry.    Psychiatric: She has a normal mood and affect.    ED Course  Procedures (including critical care time)  Labs Reviewed  WET PREP, GENITAL - Abnormal; Notable for the following:    WBC, Wet Prep HPF POC FEW (*)    All other components within normal limits  CBC WITH DIFFERENTIAL - Abnormal; Notable for the following:    WBC 11.0 (*)    All other components within normal limits  COMPREHENSIVE METABOLIC PANEL - Abnormal; Notable for the following:    Total Bilirubin 0.2 (*)    All other components within normal limits  URINALYSIS, ROUTINE W REFLEX MICROSCOPIC - Abnormal; Notable for the following:    APPearance TURBID (*)    Hgb urine dipstick LARGE (*)    All other components within normal limits  URINE MICROSCOPIC-ADD ON - Abnormal; Notable for the following:    Squamous Epithelial / LPF FEW (*)    Bacteria, UA FEW (*)    All other components within normal limits  GC/CHLAMYDIA PROBE AMP  POCT PREGNANCY, URINE  POCT PREGNANCY, URINE   US Transvaginal Non-ob  11/09/2012  *RADIOLOGY REPORT*  Pelvic ultrasound  History:  Pain and vaginal bleeding  Technique:  Study was performed transabdominally to optimize pelvic field of view evaluation and transvaginally to optimize internal visceral architectural evaluation.  Findings:  Uterus is anteverted.  Uterus measures 7.4 x 4.1 x 5.6 cm in size.  There is no intrauterine mass.  Endometrium measures 4 mm in thickness with a smooth contour.  Right ovary measures 4.1 x 2.9 4.5 cm in size.  Left ovary measures 3.1 x 2.3 x 2.2 cm in size.  There is a 3.7 x 2.4 x 3.1 cm right ovarian cyst.  No other pelvic masses are identified.  No free pelvic fluid.  Conclusion:  3.7 x 2.4 x 3.1 cm right ovarian cyst.  Study otherwise unremarkable.   Original Report Authenticated By: Bretta Bang, M.D.    US Pelvis Complete  11/09/2012  *RADIOLOGY REPORT*  Abdominal and transvaginal pelvic ultrasound reports are combined into a single dictation.   Original  Report Authenticated By: Bretta Bang, M.D.      1. Lower abdominal pain, unspecified laterality     10:00 AM Patient seen and examined. Work-up initiated. Medications ordered.   Vital signs reviewed and are as follows: Filed Vitals:   11/09/12 0852  BP: 127/75  Pulse: 73  Temp: 98.6 F (37 C)  Resp: 16   10:41 AM Pelvic exam performed by PA student under my direct supervision with nurse tech at bedside.   1:01 PM Pain medication redosed. Patient informed of all results. On exam, abdomen remains soft with mild tenderness across lower abdomen bilaterally.   The patient was urged to return to the Emergency Department immediately with worsening of current symptoms, worsening abdominal pain, persistent vomiting, blood noted in stools, fever, or any other concerns. The patient verbalized understanding.   Patient counseled on use of narcotic pain medications. Counseled not to combine these medications with others containing tylenol. Urged not  to drink alcohol, drive, or perform any other activities that requires focus while taking these medications. The patient verbalizes understanding and agrees with the plan.   MDM  Patient with pelvic pain, on period. She is not pregnant. [place] is neg. Do not suspect pain is from cyst. No unilateral findings to suspect torsion. Do not suspect PID given exam and history. Do not suspect appendicitis given no localizing pain, symptoms for 7 days. UA does not suggest infection. Patient appears well, nontoxic. Pain controlled in emergency department. No anemia. Patient will need GYN followup.         Renne Crigler, PA-C 11/09/12 1523

## 2012-11-09 NOTE — ED Notes (Signed)
Pt from home with reports of lower abdominal pain that started last Sunday with menstrual cycle. Pt reports menstruation (due to birth control) occurs every couple months but has been lasting longer beginning with a couple days of brown discharge followed by normal bleeding. Pt also reports urinary frequency but no urgency or burning. Pt denies N/V/D. Pt reports that she had appt with PCP this morning but was delayed due to weather and pt felt she could not wait so came to ED.

## 2012-11-10 ENCOUNTER — Emergency Department (HOSPITAL_COMMUNITY)
Admission: EM | Admit: 2012-11-10 | Discharge: 2012-11-10 | Disposition: A | Discharge status: Home / Self Care | Payer: Medicaid Other | Attending: Emergency Medicine | Admitting: Emergency Medicine

## 2012-11-10 ENCOUNTER — Encounter (HOSPITAL_COMMUNITY): Payer: Self-pay | Admitting: *Deleted

## 2012-11-10 DIAGNOSIS — Z8619 Personal history of other infectious and parasitic diseases: Secondary | ICD-10-CM | POA: Insufficient documentation

## 2012-11-10 DIAGNOSIS — N898 Other specified noninflammatory disorders of vagina: Secondary | ICD-10-CM | POA: Insufficient documentation

## 2012-11-10 DIAGNOSIS — N946 Dysmenorrhea, unspecified: Secondary | ICD-10-CM

## 2012-11-10 DIAGNOSIS — Z3202 Encounter for pregnancy test, result negative: Secondary | ICD-10-CM | POA: Insufficient documentation

## 2012-11-10 DIAGNOSIS — R102 Pelvic and perineal pain: Secondary | ICD-10-CM

## 2012-11-10 DIAGNOSIS — Z8711 Personal history of peptic ulcer disease: Secondary | ICD-10-CM | POA: Insufficient documentation

## 2012-11-10 DIAGNOSIS — N949 Unspecified condition associated with female genital organs and menstrual cycle: Secondary | ICD-10-CM | POA: Insufficient documentation

## 2012-11-10 DIAGNOSIS — Z87891 Personal history of nicotine dependence: Secondary | ICD-10-CM | POA: Insufficient documentation

## 2012-11-10 LAB — URINALYSIS, ROUTINE W REFLEX MICROSCOPIC
Glucose, UA: NEGATIVE mg/dL
Leukocytes, UA: NEGATIVE
Nitrite: NEGATIVE
Specific Gravity, Urine: 1.014 (ref 1.005–1.030)
pH: 6 (ref 5.0–8.0)

## 2012-11-10 LAB — CBC WITH DIFFERENTIAL/PLATELET
Basophils Relative: 0 % (ref 0–1)
Eosinophils Absolute: 0.1 10*3/uL (ref 0.0–0.7)
Eosinophils Relative: 1 % (ref 0–5)
Hemoglobin: 13.8 g/dL (ref 12.0–15.0)
Lymphs Abs: 2.4 10*3/uL (ref 0.7–4.0)
MCH: 28.5 pg (ref 26.0–34.0)
MCHC: 34 g/dL (ref 30.0–36.0)
MCV: 83.9 fL (ref 78.0–100.0)
Monocytes Absolute: 0.4 10*3/uL (ref 0.1–1.0)
Monocytes Relative: 4 % (ref 3–12)
Neutrophils Relative %: 72 % (ref 43–77)

## 2012-11-10 LAB — COMPREHENSIVE METABOLIC PANEL
Albumin: 4 g/dL (ref 3.5–5.2)
BUN: 12 mg/dL (ref 6–23)
Calcium: 9.5 mg/dL (ref 8.4–10.5)
Creatinine, Ser: 0.7 mg/dL (ref 0.50–1.10)
GFR calc Af Amer: >90 mL/min (ref >90)
Glucose, Bld: 102 mg/dL — ABNORMAL HIGH (ref 70–99)
Potassium: 4.2 mEq/L (ref 3.5–5.1)
Total Protein: 7.4 g/dL (ref 6.0–8.3)

## 2012-11-10 LAB — URINE MICROSCOPIC-ADD ON

## 2012-11-10 LAB — GC/CHLAMYDIA PROBE AMP
CT Probe RNA: NEGATIVE
GC Probe RNA: NEGATIVE

## 2012-11-10 LAB — POCT PREGNANCY, URINE: Preg Test, Ur: NEGATIVE

## 2012-11-10 MED ORDER — ONDANSETRON HCL 4 MG/2ML IJ SOLN
4.0000 mg | Freq: Once | INTRAMUSCULAR | Status: AC
  Administered 2012-11-10: 4 mg via INTRAVENOUS
  Filled 2012-11-10: qty 2

## 2012-11-10 MED ORDER — HYDROMORPHONE HCL PF 1 MG/ML IJ SOLN
1.0000 mg | Freq: Once | INTRAMUSCULAR | Status: AC
  Administered 2012-11-10: 1 mg via INTRAVENOUS
  Filled 2012-11-10: qty 1

## 2012-11-10 NOTE — ED Notes (Signed)
Pt is here with lower abdominal pain that started on Sunday with brown vaginal discharge, which she thought was her menstrual.  Then on Tuesday pain increased and she has had lower back pain

## 2012-11-10 NOTE — ED Provider Notes (Signed)
History     CSN: [id number]  Arrival date & time 11/10/12  7829   First MD Initiated Contact with Patient 11/10/12 1111      Chief Complaint  Patient presents with  . Abdominal Pain  . Vaginal Bleeding    (Consider location/radiation/quality/duration/timing/severity/associated sxs/prior treatment) HPI Comments: Patient presents today with a chief complaint of vaginal bleeding and pelvic pain.  Patient reports that she started her menstrual cycle 9 days ago and has had intermittent vaginal bleeding and pelvic cramping since that time.  She was seen in the ED for the same yesterday.  At that time a pelvic exam was done.  GC and Chlamydia were negative.  A pelvic ultrasound was also performed, which showed a right ovarian cyst but was otherwise normal.  She denies fever or chills.  Denies nausea or vomiting.  No prior history of Endometriosis.  She currently has Implanon contraceptive in place.  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    Past Medical History  Diagnosis Date  . H pylori ulcer   . H. pylori infection     Past Surgical History  Procedure Laterality Date  . Cholecystectomy    . Cesarean section      No family history on file.  History  Substance Use Topics  . Smoking status: Former Smoker -- 0.50 packs/day    Types: Cigarettes  . Smokeless tobacco: Never Used  . Alcohol Use: Yes    OB History   Grav Para Term Preterm Abortions TAB SAB Ect Mult Living                  Review of Systems  Constitutional: Negative for fever and chills.  Gastrointestinal: Negative for nausea and vomiting.  Genitourinary: Positive for vaginal bleeding and pelvic pain. Negative for dysuria, urgency and frequency.  All other systems reviewed and are negative.    Allergies  Morphine and related  Home Medications   Current Outpatient Rx  Name  Route  Sig  Dispense  Refill  . ibuprofen (ADVIL,MOTRIN) 200 MG tablet   Oral  Take 400 mg by mouth every 8 (eight) hours as needed for pain.         Marland Kitchen ibuprofen (ADVIL,MOTRIN) 600 MG tablet   Oral   Take 1 tablet (600 mg total) by mouth every 6 (six) hours as needed for pain.   20 tablet   0   . oxyCODONE-acetaminophen (PERCOCET/ROXICET) 5-325 MG per tablet   Oral   Take 1-2 tablets by mouth every 6 (six) hours as needed for pain.   10 tablet   0     BP 115/69  Pulse 96  Temp(Src) 99 F (37.2 C) (Oral)  Resp 18  SpO2 99%  LMP 10/31/2012  Physical Exam  Nursing note and vitals reviewed. Constitutional: She appears well-developed and well-nourished. No distress.  HENT:  Head: Normocephalic and atraumatic.  Mouth/Throat: Oropharynx is clear and moist.  Neck: Normal range of motion. Neck supple.  Cardiovascular: Normal rate, regular rhythm and normal heart sounds.   Pulmonary/Chest: Effort normal and breath sounds normal.  Abdominal: Soft. Normal appearance and bowel sounds are normal. She exhibits no distension and no mass. There is no rebound and no guarding.  Genitourinary: Uterus normal. Cervix exhibits no motion tenderness. Right adnexum displays no mass, no tenderness and no fullness. Left adnexum displays tenderness. Left adnexum displays no mass and no fullness.  Dark red blood visualized  in the vaginal vault.  Neurological: She is alert.  Skin: Skin is warm and dry. She is not diaphoretic.  Psychiatric: She has a normal mood and affect.    ED Course  Procedures (including critical care time)  Labs Reviewed  CBC WITH DIFFERENTIAL - Abnormal; Notable for the following:    WBC 10.6 (*)    Platelets 417 (*)    All other components within normal limits  COMPREHENSIVE METABOLIC PANEL - Abnormal; Notable for the following:    Glucose, Bld 102 (*)    All other components within normal limits  URINALYSIS, ROUTINE W REFLEX MICROSCOPIC - Abnormal; Notable for the following:    APPearance CLOUDY (*)    Hgb urine dipstick LARGE (*)    All other  components within normal limits  URINE MICROSCOPIC-ADD ON - Abnormal; Notable for the following:    Squamous Epithelial / LPF FEW (*)    All other components within normal limits  POCT PREGNANCY, URINE   US Transvaginal Non-ob  11/09/2012  *RADIOLOGY REPORT*  Pelvic ultrasound  History:  Pain and vaginal bleeding  Technique:  Study was performed transabdominally to optimize pelvic field of view evaluation and transvaginally to optimize internal visceral architectural evaluation.  Findings:  Uterus is anteverted.  Uterus measures 7.4 x 4.1 x 5.6 cm in size.  There is no intrauterine mass.  Endometrium measures 4 mm in thickness with a smooth contour.  Right ovary measures 4.1 x 2.9 4.5 cm in size.  Left ovary measures 3.1 x 2.3 x 2.2 cm in size.  There is a 3.7 x 2.4 x 3.1 cm right ovarian cyst.  No other pelvic masses are identified.  No free pelvic fluid.  Conclusion:  3.7 x 2.4 x 3.1 cm right ovarian cyst.  Study otherwise unremarkable.   Original Report Authenticated By: Bretta Bang, M.D.    US Pelvis Complete  11/09/2012  *RADIOLOGY REPORT*  Abdominal and transvaginal pelvic ultrasound reports are combined into a single dictation.   Original Report Authenticated By: Bretta Bang, M.D.      No diagnosis found.    MDM  Patient presenting with vaginal bleeding and pelvic pain.  Urine pregnancy negative.  Labs unremarkable.  Pelvic ultrasound done yesterday was normal aside from a right ovarian cyst.  No CMT on pelvic exam.  Therefore, feel that the patient is stable for discharge.  Patient instructed to follow up with Gynecology or at [facility] if symptoms worsen before she can get an appt with Gynecology.          Pascal Lux [place], PA-C 11/11/12 1007

## 2012-11-11 NOTE — ED Provider Notes (Signed)
Medical screening examination/treatment/procedure(s) were performed by non-physician practitioner and as supervising physician I was immediately available for consultation/collaboration.  Shelda Jakes, MD 11/11/12 2032

## 2012-11-20 ENCOUNTER — Inpatient Hospital Stay (HOSPITAL_COMMUNITY): Payer: Medicaid Other

## 2012-11-20 ENCOUNTER — Inpatient Hospital Stay (HOSPITAL_COMMUNITY)
Admission: AD | Admit: 2012-11-20 | Discharge: 2012-11-20 | Disposition: A | Discharge status: Home / Self Care | Payer: Medicaid Other | Source: Ambulatory Visit | Attending: Obstetrics & Gynecology | Admitting: Obstetrics & Gynecology

## 2012-11-20 ENCOUNTER — Encounter (HOSPITAL_COMMUNITY): Payer: Self-pay | Admitting: *Deleted

## 2012-11-20 DIAGNOSIS — N83209 Unspecified ovarian cyst, unspecified side: Secondary | ICD-10-CM | POA: Insufficient documentation

## 2012-11-20 DIAGNOSIS — R109 Unspecified abdominal pain: Secondary | ICD-10-CM | POA: Insufficient documentation

## 2012-11-20 LAB — COMPREHENSIVE METABOLIC PANEL
ALT: 9 U/L (ref 0–35)
Albumin: 3.4 g/dL — ABNORMAL LOW (ref 3.5–5.2)
Alkaline Phosphatase: 47 U/L (ref 39–117)
Chloride: 105 mEq/L (ref 96–112)
Glucose, Bld: 86 mg/dL (ref 70–99)
Potassium: 4.2 mEq/L (ref 3.5–5.1)
Sodium: 140 mEq/L (ref 135–145)
Total Bilirubin: 0.1 mg/dL — ABNORMAL LOW (ref 0.3–1.2)
Total Protein: 6.4 g/dL (ref 6.0–8.3)

## 2012-11-20 LAB — URINALYSIS, ROUTINE W REFLEX MICROSCOPIC
Glucose, UA: NEGATIVE mg/dL
Leukocytes, UA: NEGATIVE
Protein, ur: NEGATIVE mg/dL
Specific Gravity, Urine: 1.02 (ref 1.005–1.030)
pH: 5.5 (ref 5.0–8.0)

## 2012-11-20 LAB — CBC
Hemoglobin: 11.8 g/dL — ABNORMAL LOW (ref 12.0–15.0)
MCHC: 33.1 g/dL (ref 30.0–36.0)
WBC: 12.6 10*3/uL — ABNORMAL HIGH (ref 4.0–10.5)

## 2012-11-20 LAB — URINE MICROSCOPIC-ADD ON

## 2012-11-20 LAB — HCG, QUANTITATIVE, PREGNANCY: hCG, Beta Chain, Quant, S: <1 m[IU]/mL (ref <5)

## 2012-11-20 LAB — POCT PREGNANCY, URINE: Preg Test, Ur: NEGATIVE

## 2012-11-20 MED ORDER — HYDROMORPHONE HCL 2 MG PO TABS
2.0000 mg | ORAL_TABLET | Freq: Four times a day (QID) | ORAL | Status: DC | PRN
Start: 2012-11-20 — End: 2013-04-08

## 2012-11-20 MED ORDER — KETOROLAC TROMETHAMINE 60 MG/2ML IM SOLN
60.0000 mg | Freq: Once | INTRAMUSCULAR | Status: AC
  Administered 2012-11-20: 60 mg via INTRAMUSCULAR
  Filled 2012-11-20: qty 2

## 2012-11-20 MED ORDER — HYDROMORPHONE HCL 2 MG PO TABS
2.0000 mg | ORAL_TABLET | Freq: Once | ORAL | Status: AC
  Administered 2012-11-20: 2 mg via ORAL
  Filled 2012-11-20: qty 1

## 2012-11-20 NOTE — MAU Note (Signed)
PT SAYS ON 3-18-  WENT TO [facility]- BECAUSE HAD BROWN D/C -  AND STARTED HURTING IN PELVIC AREA--  AND HAS CONTINUED TO HURT.  [facility]  GAVE HER  OXYCODONE -  LAST TIME TAKEN - 10AM- WITH NO RELIEF.    THEN TOOK IBUPROFEN AT 12 NOON- 600 MG.- NO RELIEF.   AT [facility]- DID  U/S , SPEC EXAM AND DRAW LABS.-   TOLD HER  SHE WAS HAVING BAD  CYCLE- THEN SHE  WENT BACK ON WED- TOLD HER IT WAS BECAUSE OF HER CYCLE.     TOLD TO COME HERE IF WORSE.

## 2012-11-20 NOTE — MAU Provider Note (Signed)
History     CSN: [id number]  Arrival date and time: 11/20/12 8657   First Provider Initiated Contact with Patient 11/20/12 2013      No chief complaint on file.  HPI [name] 27 y.o. Comes to MAU with lower abdominal pain which she has had since her visit at the ER on 11-09-12.  Today she took Ibuprofen (12 noon) and Oxycodone but neither helped the abdominal pain.  Currently tearful.  Had vaginal bleeding which stopped on Monday.  In the ER she had an ultrasound and diagnosed with a right ovarian cyst.  Client reports daily BMs  - hard.  Vomited twice yesterday - none today.  Ate steak, egg and cheese sub for breakfast and a roast beef sandwich for lunch.  Is tearful with pain. Pain 10/10  OB History   Grav Para Term Preterm Abortions TAB SAB Ect Mult Living   1 1 1       1       Past Medical History  Diagnosis Date  . H pylori ulcer   . H. pylori infection     Past Surgical History  Procedure Laterality Date  . Cholecystectomy    . Cesarean section      Family History  Problem Relation Age of Onset  . Hypertension Mother     History  Substance Use Topics  . Smoking status: Former Smoker -- 0.50 packs/day    Types: Cigarettes  . Smokeless tobacco: Never Used  . Alcohol Use: Yes     Comment: occasional    Allergies:  Allergies  Allergen Reactions  . Morphine And Related Shortness Of Breath    Prescriptions prior to admission  Medication Sig Dispense Refill  . ibuprofen (ADVIL,MOTRIN) 600 MG tablet Take 1 tablet (600 mg total) by mouth every 6 (six) hours as needed for pain.  20 tablet  0  . oxyCODONE-acetaminophen (PERCOCET/ROXICET) 5-325 MG per tablet Take 1-2 tablets by mouth every 6 (six) hours as needed for pain.  10 tablet  0    Review of Systems  Constitutional: Negative for fever.  Gastrointestinal: Positive for vomiting and abdominal pain. Negative for diarrhea and constipation.  Genitourinary:       No vaginal discharge. No vaginal  bleeding. No dysuria.   Physical Exam   Blood pressure 124/68, pulse 85, temperature 98.3 F (36.8 C), temperature source Oral, resp. rate 18, height 5\' 5"  (1.651 m), weight 240 lb 2 oz (108.92 kg), last menstrual period 10/31/2012.  Physical Exam  Nursing note and vitals reviewed. Constitutional: She is oriented to person, place, and time. She appears well-developed and well-nourished. She appears distressed.  HENT:  Head: Normocephalic.  Eyes: EOM are normal.  Neck: Neck supple.  GI: Soft. There is tenderness. There is no rebound and no guarding.  Has pain in LUQ< LLQ, and RLQ.  No tenderness with palpation in RUQ.  Musculoskeletal: Normal range of motion.  Neurological: She is alert and oriented to person, place, and time.  Skin: Skin is warm and dry.  Psychiatric: She has a normal mood and affect.    MAU Course  Procedures Clinical Data: Pelvic pain. History of ovarian cyst.  TRANSVAGINAL ULTRASOUND OF PELVIS  Technique: Transvaginal ultrasound examination of the pelvis was  performed including evaluation of the uterus, ovaries, adnexal  regions, and pelvic cul-de-sac.  Comparison: 11/09/2012.  Findings:  Uterus: Normal in size and echotexture measuring 8.9 x 8.4 x 5.4  cm. No focal cystic or solid uterine lesions.  Endometrium:  Thin endometrium measuring only 4 mm.  Right ovary: Right ovary measures 4.9 x 2.5 x 4.0 cm. There is an  anechoic lesion with increased through transmission and no internal  blood flow or internal architecture measuring 3.2 x 2.4 x 3.2 cm,  compatible with a cyst.  Left ovary: Normal in echotexture and appearance measuring 3.0 x  2.2 x 2.4 cm. No focal cystic or solid lesions. Multiple small  follicles.  Other Findings: No significant free fluid within the cul-de-sac.  IMPRESSION:  1. No acute findings to account for the patient's symptoms.  2. 3.2 x 2.4 x 3.2 cm simple cyst in the right ovary.   Results for orders placed during the  hospital encounter of 11/20/12 (from the past 24 hour(s))  URINALYSIS, ROUTINE W REFLEX MICROSCOPIC     Status: Abnormal   Collection Time    11/20/12  6:34 PM      Result Value Range   Color, Urine YELLOW  YELLOW   APPearance CLEAR  CLEAR   Specific Gravity, Urine 1.020  1.005 - 1.030   pH 5.5  5.0 - 8.0   Glucose, UA NEGATIVE  NEGATIVE mg/dL   Hgb urine dipstick TRACE (*) NEGATIVE   Bilirubin Urine NEGATIVE  NEGATIVE   Ketones, ur NEGATIVE  NEGATIVE mg/dL   Protein, ur NEGATIVE  NEGATIVE mg/dL   Urobilinogen, UA 0.2  0.0 - 1.0 mg/dL   Nitrite NEGATIVE  NEGATIVE   Leukocytes, UA NEGATIVE  NEGATIVE  URINE MICROSCOPIC-ADD ON     Status: None   Collection Time    11/20/12  6:34 PM      Result Value Range   Squamous Epithelial / LPF RARE  RARE   WBC, UA 0-2  <3 WBC/hpf   RBC / HPF 0-2  <3 RBC/hpf   Bacteria, UA RARE  RARE  POCT PREGNANCY, URINE     Status: None   Collection Time    11/20/12  7:18 PM      Result Value Range   Preg Test, Ur NEGATIVE  NEGATIVE  CBC     Status: Abnormal   Collection Time    11/20/12  8:13 PM      Result Value Range   WBC 12.6 (*) 4.0 - 10.5 K/uL   RBC 4.21  3.87 - 5.11 MIL/uL   Hemoglobin 11.8 (*) 12.0 - 15.0 g/dL   HCT 29.5 (*) 62.1 - 30.8 %   MCV 84.6  78.0 - 100.0 fL   MCH 28.0  26.0 - 34.0 pg   MCHC 33.1  30.0 - 36.0 g/dL   RDW 65.7  84.6 - 96.2 %   Platelets 352  150 - 400 K/uL  COMPREHENSIVE METABOLIC PANEL     Status: Abnormal   Collection Time    11/20/12  8:13 PM      Result Value Range   Sodium 140  135 - 145 mEq/L   Potassium 4.2  3.5 - 5.1 mEq/L   Chloride 105  96 - 112 mEq/L   CO2 23  19 - 32 mEq/L   Glucose, Bld 86  70 - 99 mg/dL   BUN 13  6 - 23 mg/dL   Creatinine, Ser 9.52  0.50 - 1.10 mg/dL   Calcium 9.3  8.4 - 84.1 mg/dL   Total Protein 6.4  6.0 - 8.3 g/dL   Albumin 3.4 (*) 3.5 - 5.2 g/dL   AST 13  0 - 37 U/L   ALT 9  0 - 35  U/L   Alkaline Phosphatase 47  39 - 117 U/L   Total Bilirubin 0.1 (*) 0.3 - 1.2 mg/dL    GFR calc non Af Amer >90  >90 mL/min   GFR calc Af Amer >90  >90 mL/min  HCG, QUANTITATIVE, PREGNANCY     Status: None   Collection Time    11/20/12  8:13 PM      Result Value Range   hCG, Beta Chain, Quant, S <1  <5 mIU/mL    MDM Consult with Dr. Macon Large re: plan of care.  Toradol 60 mg IM given which did not help pain.  Dilaudid 2 mg PO given for pain.  Assessment and Plan  Abdominal pain  - unknown cause  Plan Has an appointment to see GYN MD on Thursday No infection found today. Ovarian cyst is stable and no other serious condition is found. Client seems more comfortable - is resting in bed - reports pain is 9/10. Rx dilaudid 2 mg One po q 6 hours as needed for pain (#10) no refills   Carliss Porcaro 11/20/2012, 8:28 PM

## 2012-11-21 NOTE — MAU Provider Note (Signed)
Attestation of Attending Supervision of Advanced Practitioner (PA/CNM/NP): Evaluation and management procedures were performed by the Advanced Practitioner under my supervision and collaboration.  I have reviewed the Advanced Practitioner's note and chart, and I agree with the management and plan.  Kalee Mcclenathan, MD, FACOG Attending Obstetrician & Gynecologist Faculty Practice, [facility]  

## 2012-11-25 ENCOUNTER — Encounter: Payer: Self-pay | Admitting: Medical

## 2012-11-25 ENCOUNTER — Encounter (HOSPITAL_COMMUNITY): Payer: Self-pay | Admitting: Emergency Medicine

## 2012-11-25 ENCOUNTER — Emergency Department (HOSPITAL_COMMUNITY)
Admission: EM | Admit: 2012-11-25 | Discharge: 2012-11-26 | Disposition: A | Discharge status: Home / Self Care | Payer: Medicaid Other | Attending: Emergency Medicine | Admitting: Emergency Medicine

## 2012-11-25 ENCOUNTER — Ambulatory Visit (INDEPENDENT_AMBULATORY_CARE_PROVIDER_SITE_OTHER): Payer: Medicaid Other | Admitting: Medical

## 2012-11-25 VITALS — BP 129/82 | HR 75 | Temp 97.9°F | Ht 65.0 in | Wt 241.5 lb

## 2012-11-25 DIAGNOSIS — N949 Unspecified condition associated with female genital organs and menstrual cycle: Secondary | ICD-10-CM

## 2012-11-25 DIAGNOSIS — R109 Unspecified abdominal pain: Secondary | ICD-10-CM

## 2012-11-25 DIAGNOSIS — R102 Pelvic and perineal pain: Secondary | ICD-10-CM

## 2012-11-25 DIAGNOSIS — N83201 Unspecified ovarian cyst, right side: Secondary | ICD-10-CM

## 2012-11-25 DIAGNOSIS — Z3202 Encounter for pregnancy test, result negative: Secondary | ICD-10-CM | POA: Insufficient documentation

## 2012-11-25 DIAGNOSIS — F17200 Nicotine dependence, unspecified, uncomplicated: Secondary | ICD-10-CM | POA: Insufficient documentation

## 2012-11-25 DIAGNOSIS — M545 Low back pain, unspecified: Secondary | ICD-10-CM | POA: Insufficient documentation

## 2012-11-25 DIAGNOSIS — N83209 Unspecified ovarian cyst, unspecified side: Secondary | ICD-10-CM

## 2012-11-25 DIAGNOSIS — Z9089 Acquired absence of other organs: Secondary | ICD-10-CM | POA: Insufficient documentation

## 2012-11-25 DIAGNOSIS — Z8619 Personal history of other infectious and parasitic diseases: Secondary | ICD-10-CM | POA: Insufficient documentation

## 2012-11-25 LAB — CBC WITH DIFFERENTIAL/PLATELET
Eosinophils Absolute: 0.2 10*3/uL (ref 0.0–0.7)
Lymphocytes Relative: 30 % (ref 12–46)
Lymphs Abs: 3.8 10*3/uL (ref 0.7–4.0)
Neutro Abs: 7.9 10*3/uL — ABNORMAL HIGH (ref 1.7–7.7)
Neutrophils Relative %: 63 % (ref 43–77)
Platelets: 369 10*3/uL (ref 150–400)
RBC: 4.4 MIL/uL (ref 3.87–5.11)
WBC: 12.5 10*3/uL — ABNORMAL HIGH (ref 4.0–10.5)

## 2012-11-25 LAB — URINALYSIS, ROUTINE W REFLEX MICROSCOPIC
Leukocytes, UA: NEGATIVE
Nitrite: NEGATIVE
Specific Gravity, Urine: 1.016 (ref 1.005–1.030)
Urobilinogen, UA: 0.2 mg/dL (ref 0.0–1.0)

## 2012-11-25 LAB — COMPREHENSIVE METABOLIC PANEL
ALT: 9 U/L (ref 0–35)
Alkaline Phosphatase: 50 U/L (ref 39–117)
CO2: 23 mEq/L (ref 19–32)
GFR calc Af Amer: >90 mL/min (ref >90)
Glucose, Bld: 84 mg/dL (ref 70–99)
Potassium: 3.7 mEq/L (ref 3.5–5.1)
Sodium: 137 mEq/L (ref 135–145)
Total Protein: 6.8 g/dL (ref 6.0–8.3)

## 2012-11-25 NOTE — Progress Notes (Signed)
Reports pelvic pain for last 3 weeks straight and has been seen in the ER 3 times for this. States nothing that has been done in the ER's have helped her. Also states dilaudid does nothing for her pain. Hurts to sit or stand or lay down for long periods of time

## 2012-11-25 NOTE — ED Notes (Signed)
PT. REPORTS VAGINAL DISCHARGE FOR SEVERAL DAYS WITH LOW ABDOMINAL PAIN / LOW BACK PAIN AND PELVIC PAIN . DENIES FEVER OR CHILLS.

## 2012-11-25 NOTE — Progress Notes (Signed)
Patient ID: [name], female   DOB: 1986-04-04, 27 y.o.   MRN: [id number]  History:  [name] is a 27 y.o. G1P1001 who presents to clinic today for pelvic pain. The patient states that this has been consistent pain x 3 weeks. The pain is 10/10 and constant. She has been seen and evaluated in the ED/MAU 3 times since the pain began. She has had negative cultures and UA. US shows one small right ovarian cyst that does not account for her pain. She patient states that she has not been able to stand or sit for long periods of time because of the pain. She is not sleeping well because of the pain. She has had a colonoscopy and endoscopy for H. Pylori and associated symptoms within the last 6 months and those tests were otherwise normal. The patient has a Nexplanon x 1 year and has irregular cycles. The pain is not associated with her bleeding. LMP 10/31/12. She also states that she has a PCP at [facility].  Patient has been given percocet, ibuprofen and dilaudid PO and they are not controlling her pain. Patient has been given IV and IM dilaudid and toradol in ED without significant improvement.   The following portions of the patient's history were reviewed and updated as appropriate: allergies, current medications, past family history, past medical history, past social history, past surgical history and problem list.  Review of Systems:  Pertinent items are noted in HPI.  Objective:  Physical Exam BP 129/82  Pulse 75  Temp(Src) 97.9 F (36.6 C) (Oral)  Ht 5\' 5"  (1.651 m)  Wt 241 lb 8 oz (109.544 kg)  BMI 40.19 kg/m2  LMP 10/31/2012 GENERAL: Well-developed, well-nourished female in no acute distress.  HEENT: Normocephalic, atraumatic.  LUNGS: Normal effort HEART: Regular rate  ABDOMEN: Soft, mild to moderate tenderness to palpation of the lower abdomen, no point tenderness, nondistended. No organomegaly. Normal bowel sounds appreciated in all quadrants.  PELVIC:  Normal external female genitalia. Vagina is pink and rugated.  Normal discharge and small amount of dark brown blood noted. Normal cervix contour. Uterus is normal in size. No adnexal mass or tenderness.  EXTREMITIES: No cyanosis, clubbing, or edema, 2+ distal pulses.  Labs and Imaging US Transvaginal Non-ob  11/20/2012  *RADIOLOGY REPORT*  Clinical Data: Pelvic pain.  History of ovarian cyst.  TRANSVAGINAL ULTRASOUND OF PELVIS  Technique:  Transvaginal ultrasound examination of the pelvis was performed including evaluation of the uterus, ovaries, adnexal regions, and pelvic cul-de-sac.  Comparison:  11/09/2012.  Findings:  Uterus:  Normal in size and echotexture measuring 8.9 x 8.4 x 5.4 cm.  No focal cystic or solid uterine lesions.  Endometrium: Thin endometrium measuring only 4 mm.  Right ovary: Right ovary measures 4.9 x 2.5 x 4.0 cm.  There is an anechoic lesion with increased through transmission and no internal blood flow or internal architecture measuring 3.2 x 2.4 x 3.2 cm, compatible with a cyst.  Left ovary: Normal in echotexture and appearance measuring 3.0 x 2.2 x 2.4 cm.  No focal cystic or solid lesions.  Multiple small follicles.  Other Findings:  No significant free fluid within the cul-de-sac.  IMPRESSION: 1.  No acute findings to account for the patient's symptoms. 2.  3.2 x 2.4 x 3.2 cm simple cyst in the right ovary.   Original Report Authenticated By: Trudie Reed, M.D.     Assessment & Plan:  Assessment: Abdominal pain of unknown origin  Plans: Patient  advised to go to ED for further work-up and pain control if PO medications previously prescribed continue to be inadequate for pain control Patient also advised to call PCP ASAP for further work-up and possible urology referral Patient may return to clinic PRN

## 2012-11-25 NOTE — Patient Instructions (Addendum)
Abdominal Pain (Nonspecific)  Your exam might not show the exact reason you have abdominal pain. Since there are many different causes of abdominal pain, another checkup and more tests may be needed. It is very important to follow up for lasting (persistent) or worsening symptoms. A possible cause of abdominal pain in any person who still has his or her appendix is acute appendicitis. Appendicitis is often hard to diagnose. Normal blood tests, urine tests, ultrasound, and CT scans do not completely rule out early appendicitis or other causes of abdominal pain. Sometimes, only the changes that happen over time will allow appendicitis and other causes of abdominal pain to be determined. Other potential problems that may require surgery may also take time to become more apparent. Because of this, it is important that you follow all of the instructions below.  HOME CARE INSTRUCTIONS    Rest as much as possible.   Do not eat solid food until your pain is gone.   While adults or children have pain: A diet of water, weak decaffeinated tea, broth or bouillon, gelatin, oral rehydration solutions (ORS), frozen ice pops, or ice chips may be helpful.   When pain is gone in adults or children: Start a light diet (dry toast, crackers, applesauce, or white rice). Increase the diet slowly as long as it does not bother you. Eat no dairy products (including cheese and eggs) and no spicy, fatty, fried, or high-fiber foods.   Use no alcohol, caffeine, or cigarettes.   Take your regular medicines unless your caregiver told you not to.   Take any prescribed medicine as directed.   Only take over-the-counter or prescription medicines for pain, discomfort, or fever as directed by your caregiver. Do not give aspirin to children.  If your caregiver has given you a follow-up appointment, it is very important to keep that appointment. Not keeping the appointment could result in a permanent injury and/or lasting (chronic) pain and/or  disability. If there is any problem keeping the appointment, you must call to reschedule.   SEEK IMMEDIATE MEDICAL CARE IF:    Your pain is not gone in 24 hours.   Your pain becomes worse, changes location, or feels different.   You or your child has an oral temperature above 102 F (38.9 C), not controlled by medicine.   Your baby is older than 3 months with a rectal temperature of 102 F (38.9 C) or higher.   Your baby is 3 months old or younger with a rectal temperature of 100.4 F (38 C) or higher.   You have shaking chills.   You keep throwing up (vomiting) or cannot drink liquids.   There is blood in your vomit or you see blood in your bowel movements.   Your bowel movements become dark or black.   You have frequent bowel movements.   Your bowel movements stop (become blocked) or you cannot pass gas.   You have bloody, frequent, or painful urination.   You have yellow discoloration in the skin or whites of the eyes.   Your stomach becomes bloated or bigger.   You have dizziness or fainting.   You have chest or back pain.  MAKE SURE YOU:    Understand these instructions.   Will watch your condition.   Will get help right away if you are not doing well or get worse.  Document Released: 08/11/2005 Document Revised: 11/03/2011 Document Reviewed: 07/09/2009  Exit[employer] Patient Information 2013 [employer].

## 2012-11-26 ENCOUNTER — Emergency Department (HOSPITAL_COMMUNITY): Payer: Medicaid Other

## 2012-11-26 ENCOUNTER — Encounter (HOSPITAL_COMMUNITY): Payer: Self-pay | Admitting: Radiology

## 2012-11-26 MED ORDER — CEFTRIAXONE SODIUM 250 MG IJ SOLR
250.0000 mg | Freq: Once | INTRAMUSCULAR | Status: AC
  Administered 2012-11-26: 250 mg via INTRAMUSCULAR
  Filled 2012-11-26: qty 250

## 2012-11-26 MED ORDER — IOHEXOL 300 MG/ML  SOLN
50.0000 mL | Freq: Once | INTRAMUSCULAR | Status: AC | PRN
  Administered 2012-11-26: 50 mL via ORAL

## 2012-11-26 MED ORDER — OXYCODONE-ACETAMINOPHEN 5-325 MG PO TABS
1.0000 | ORAL_TABLET | Freq: Once | ORAL | Status: AC
  Administered 2012-11-26: 1 via ORAL
  Filled 2012-11-26: qty 1

## 2012-11-26 MED ORDER — IOHEXOL 300 MG/ML  SOLN
100.0000 mL | Freq: Once | INTRAMUSCULAR | Status: AC | PRN
  Administered 2012-11-26: 100 mL via INTRAVENOUS

## 2012-11-26 MED ORDER — DOXYCYCLINE HYCLATE 100 MG PO CAPS: 100.0000 mg | ORAL_CAPSULE | Freq: Two times a day (BID) | ORAL | Status: DC

## 2012-11-26 MED ORDER — METRONIDAZOLE 500 MG PO TABS: 500.0000 mg | ORAL_TABLET | Freq: Two times a day (BID) | ORAL | Status: DC

## 2012-11-26 MED ORDER — ONDANSETRON HCL 4 MG/2ML IJ SOLN
4.0000 mg | Freq: Once | INTRAMUSCULAR | Status: AC
  Administered 2012-11-26: 4 mg via INTRAVENOUS
  Filled 2012-11-26: qty 2

## 2012-11-26 MED ORDER — LIDOCAINE HCL (PF) 1 % IJ SOLN
INTRAMUSCULAR | Status: AC
  Administered 2012-11-26: 2 mL
  Filled 2012-11-26: qty 5

## 2012-11-26 MED ORDER — HYDROMORPHONE HCL PF 1 MG/ML IJ SOLN
1.0000 mg | Freq: Once | INTRAMUSCULAR | Status: AC
  Administered 2012-11-26: 1 mg via INTRAVENOUS
  Filled 2012-11-26: qty 1

## 2012-11-26 NOTE — ED Provider Notes (Signed)
History     CSN: [id number]  Arrival date & time 11/25/12  2106   First MD Initiated Contact with Patient 11/26/12 0009      Chief Complaint  Patient presents with  . Vaginal Discharge     Patient is a 27 y.o. female presenting with vaginal discharge. The history is provided by the patient.  Vaginal Discharge This is a recurrent problem. The current episode started more than 1 week ago. The problem occurs daily. The problem has not changed since onset.Associated symptoms include abdominal pain. Nothing aggravates the symptoms. Nothing relieves the symptoms. She has tried rest (pain meds) for the symptoms. The treatment provided no relief.    Past Medical History  Diagnosis Date  . H pylori ulcer   . H. pylori infection     Past Surgical History  Procedure Laterality Date  . Cholecystectomy    . Cesarean section      Family History  Problem Relation Age of Onset  . Hypertension Mother   . Diabetes Maternal Grandmother   . Hypertension Maternal Grandmother     History  Substance Use Topics  . Smoking status: Current Every Day Smoker -- 0.50 packs/day for 10 years    Types: Cigarettes  . Smokeless tobacco: Never Used  . Alcohol Use: Yes     Comment: occasional    OB History   Grav Para Term Preterm Abortions TAB SAB Ect Mult Living   1 1 1  0 0 0 0 0 0 1      Review of Systems  Constitutional: Negative for fever.  Respiratory: Negative for cough.   Gastrointestinal: Positive for abdominal pain.  Genitourinary: Positive for vaginal discharge. Negative for dysuria and vaginal bleeding.  All other systems reviewed and are negative.    Allergies  Morphine and related  Home Medications   Current Outpatient Rx  Name  Route  Sig  Dispense  Refill  . HYDROmorphone (DILAUDID) 2 MG tablet   Oral   Take 1 tablet (2 mg total) by mouth every 6 (six) hours as needed for pain.   10 tablet   0   . ibuprofen (ADVIL,MOTRIN) 600 MG tablet   Oral   Take 1 tablet  (600 mg total) by mouth every 6 (six) hours as needed for pain.   20 tablet   0     BP 145/79  Pulse 77  Temp(Src) 99 F (37.2 C) (Oral)  Resp 18  SpO2 100%  LMP 10/31/2012  Physical Exam CONSTITUTIONAL: Well developed/well nourished HEAD: Normocephalic/atraumatic EYES: EOMI/PERRL ENMT: Mucous membranes moist NECK: supple no meningeal signs SPINE:entire spine nontender CV: S1/S2 noted, no murmurs/rubs/gallops noted LUNGS: Lungs are clear to auscultation bilaterally, no apparent distress ABDOMEN: soft, diffuse lower abdominal tenderness, tenderness is moderate, no rebound or guarding GU:no cva tenderness NEURO: Pt is awake/alert, moves all extremitiesx4 EXTREMITIES: pulses normal, full ROM SKIN: warm, color normal PSYCH: no abnormalities of mood noted  ED Course  Procedures   Labs Reviewed  URINALYSIS, ROUTINE W REFLEX MICROSCOPIC - Abnormal; Notable for the following:    Hgb urine dipstick TRACE (*)    All other components within normal limits  CBC WITH DIFFERENTIAL - Abnormal; Notable for the following:    WBC 12.5 (*)    Neutro Abs 7.9 (*)    All other components within normal limits  COMPREHENSIVE METABOLIC PANEL - Abnormal; Notable for the following:    Total Bilirubin 0.2 (*)    All other components within normal limits  URINE MICROSCOPIC-ADD ON  POCT PREGNANCY, URINE   12:56 AM Pt here for continued abdominal pain and vag discharge She reports symptoms for over 3 weeks She has been seen in the ED several times as well saw gynecologist today Gynecologist told her she may need CT scan She just had pelvic exam today by the gynecologist per patient Will obtain CT imaging given her continued lower abdominal pain.  Will defer repeat pelvic exam as she just had one per patient She reports she has not been on antibiotics as of yet 3:35 AM Pt stable at this time.  She had drop in BP, though she is asymptomatic.  Suspect his may be due to pain meds.   Her abdomen  is soft but she still reports pain in bilateral lower quadrants Given she reports continued vag discharge and will empirically treat for PID, though previous testing revealed negative GC/chlamydia.  Otherwise I feel she is safe/stable for d/c home and f/u as outpatient with her gynecologist MDM  Nursing notes including past medical history and social history reviewed and considered in documentation Labs/vital reviewed and considered Previous records reviewed and considered - recent ED visit reviewed         Joya Gaskins, MD 11/26/12 [phone]

## 2012-12-16 ENCOUNTER — Emergency Department (HOSPITAL_COMMUNITY)
Admission: EM | Admit: 2012-12-16 | Discharge: 2012-12-16 | Disposition: A | Discharge status: Home / Self Care | Payer: Medicaid Other | Attending: Emergency Medicine | Admitting: Emergency Medicine

## 2012-12-16 ENCOUNTER — Emergency Department (HOSPITAL_COMMUNITY): Payer: Medicaid Other

## 2012-12-16 ENCOUNTER — Encounter (HOSPITAL_COMMUNITY): Payer: Self-pay | Admitting: Emergency Medicine

## 2012-12-16 DIAGNOSIS — S92919A Unspecified fracture of unspecified toe(s), initial encounter for closed fracture: Secondary | ICD-10-CM | POA: Insufficient documentation

## 2012-12-16 DIAGNOSIS — Y92009 Unspecified place in unspecified non-institutional (private) residence as the place of occurrence of the external cause: Secondary | ICD-10-CM | POA: Insufficient documentation

## 2012-12-16 DIAGNOSIS — R209 Unspecified disturbances of skin sensation: Secondary | ICD-10-CM | POA: Insufficient documentation

## 2012-12-16 DIAGNOSIS — Z8619 Personal history of other infectious and parasitic diseases: Secondary | ICD-10-CM | POA: Insufficient documentation

## 2012-12-16 DIAGNOSIS — Z79899 Other long term (current) drug therapy: Secondary | ICD-10-CM | POA: Insufficient documentation

## 2012-12-16 DIAGNOSIS — F17200 Nicotine dependence, unspecified, uncomplicated: Secondary | ICD-10-CM | POA: Insufficient documentation

## 2012-12-16 DIAGNOSIS — Y9302 Activity, running: Secondary | ICD-10-CM | POA: Insufficient documentation

## 2012-12-16 DIAGNOSIS — S92912A Unspecified fracture of left toe(s), initial encounter for closed fracture: Secondary | ICD-10-CM

## 2012-12-16 DIAGNOSIS — IMO0002 Reserved for concepts with insufficient information to code with codable children: Secondary | ICD-10-CM | POA: Insufficient documentation

## 2012-12-16 DIAGNOSIS — Z8719 Personal history of other diseases of the digestive system: Secondary | ICD-10-CM | POA: Insufficient documentation

## 2012-12-16 MED ORDER — OXYCODONE-ACETAMINOPHEN 5-325 MG PO TABS: 1.0000 | ORAL_TABLET | ORAL | Status: DC | PRN

## 2012-12-16 NOTE — ED Provider Notes (Signed)
History    This chart was scribed for non-physician practitioner working with Carleene Cooper III, MD by Donne Anon, ED Scribe. This patient was seen in room TR08C/TR08C and the patient's care was started at 1547.   CSN: [id number]  Arrival date & time 12/16/12  1534   First MD Initiated Contact with Patient 12/16/12 1547      No chief complaint on file.    The history is provided by the patient. No language interpreter was used.   [name] is a 27 y.o. female who presents to the Emergency Department complaining of sudden onset, constant, gradually worsening left great toe pain which radiates into her foot and is described as throbbing and burning and began last night when she stubbed her toe while she was running in her yard, tripping over a stump.  Pain is worse with palpation and ambulation. Had one episode of left foot numbness that improved with walking around.    Denies fever, leg swelling, left leg weakness.    Past Medical History  Diagnosis Date  . H pylori ulcer   . H. pylori infection     Past Surgical History  Procedure Laterality Date  . Cholecystectomy    . Cesarean section      Family History  Problem Relation Age of Onset  . Hypertension Mother   . Diabetes Maternal Grandmother   . Hypertension Maternal Grandmother     History  Substance Use Topics  . Smoking status: Current Every Day Smoker -- 0.50 packs/day for 10 years    Types: Cigarettes  . Smokeless tobacco: Never Used  . Alcohol Use: Yes     Comment: occasional    OB History   Grav Para Term Preterm Abortions TAB SAB Ect Mult Living   1 1 1  0 0 0 0 0 0 1      Review of Systems  Cardiovascular: Negative for leg swelling.  Skin: Positive for color change. Negative for pallor, rash and wound.  Neurological: Positive for numbness. Negative for weakness.    Allergies  Morphine and related  Home Medications   Current Outpatient Rx  Name  Route  Sig  Dispense  Refill  .  doxycycline (VIBRAMYCIN) 100 MG capsule   Oral   Take 1 capsule (100 mg total) by mouth 2 (two) times daily.   28 capsule   0   . HYDROmorphone (DILAUDID) 2 MG tablet   Oral   Take 1 tablet (2 mg total) by mouth every 6 (six) hours as needed for pain.   10 tablet   0   . ibuprofen (ADVIL,MOTRIN) 600 MG tablet   Oral   Take 1 tablet (600 mg total) by mouth every 6 (six) hours as needed for pain.   20 tablet   0   . metroNIDAZOLE (FLAGYL) 500 MG tablet   Oral   Take 1 tablet (500 mg total) by mouth 2 (two) times daily.   28 tablet   0     Do not take this medication with alcohol     BP 124/73  Pulse 80  Temp(Src) 98.5 F (36.9 C) (Oral)  SpO2 96%  LMP 10/31/2012  Physical Exam  Nursing note and vitals reviewed. Constitutional: She appears well-developed and well-nourished. No distress.  HENT:  Head: Normocephalic and atraumatic.  Neck: Neck supple.  Pulmonary/Chest: Effort normal.  Musculoskeletal:       Left ankle: Normal.       Left lower leg: Normal.  Left foot: She exhibits decreased range of motion, tenderness, bony tenderness and swelling. She exhibits normal capillary refill, no crepitus, no deformity and no laceration.       Feet:  Left ankle/foot: Left ankle is normal. Cap refill <2 sec throughout. Distal pulses intact. Left foot: Tenderness in entire first phalanx including the MTP. Ecchymosis present over interphalangeal joint, medial aspect.    Neurological: She is alert.  Skin: She is not diaphoretic.    ED Course  Procedures (including critical care time) DIAGNOSTIC STUDIES: Oxygen Saturation is 96% on room air, adequate by my interpretation.    COORDINATION OF CARE: 4:13 PM Discussed treatment plan which includes foot xray with pt at bedside and pt agreed to plan.   4:28 PM Rechecked pt. Informed of positive x-ray results. Will buddy tape toe, give crutches, a post op shoe and pain medication. Advised pt to follow up with PCP and  orthopedist.    Labs Reviewed - No data to display Dg Toe Great Left  12/16/2012  *RADIOLOGY REPORT*  Clinical Data: Bruising and pain left great toe, injury  LEFT GREAT TOE  Comparison: 04/20/2008  Findings: Nondisplaced fracture medial aspect base of the distal phalanx left great toe, suspect extending intra-articular at IP joint. Joint spaces preserved. No additional fracture or dislocation identified. Osseous mineralization normal.  IMPRESSION: Nondisplaced fracture at base of distal phalanx left great toe, suspect extending intra-articular at IP joint.   Original Report Authenticated By: Ulyses Southward, M.D.      1. Toe fracture, left, closed, initial encounter      MDM  Pt with tripping injury yesterday injuring left great toe.  Xray shows nondisplaced fracture with possible intraarticular extension.  Pt d/c with buddy tape, postop shoe, crutches, pain medication, ortho and PCP follow up.  Discussed all results with patient.  Pt given return precautions.  Pt verbalizes understanding and agrees with plan.           Trixie Dredge, PA-C 12/16/12 1723

## 2012-12-16 NOTE — ED Notes (Signed)
Pt stubbed left great toe on object in the yard while she was running last night. Left great toe swollen and discolored.

## 2012-12-17 NOTE — ED Provider Notes (Signed)
Medical screening examination/treatment/procedure(s) were performed by non-physician practitioner and as supervising physician I was immediately available for consultation/collaboration.   Carleene Cooper III, MD 12/17/12 [phone]

## 2013-04-08 ENCOUNTER — Encounter (HOSPITAL_COMMUNITY): Payer: Self-pay | Admitting: Emergency Medicine

## 2013-04-08 ENCOUNTER — Emergency Department (HOSPITAL_COMMUNITY)
Admission: EM | Admit: 2013-04-08 | Discharge: 2013-04-08 | Disposition: A | Discharge status: Home / Self Care | Payer: Medicaid Other | Attending: Emergency Medicine | Admitting: Emergency Medicine

## 2013-04-08 DIAGNOSIS — F17200 Nicotine dependence, unspecified, uncomplicated: Secondary | ICD-10-CM | POA: Insufficient documentation

## 2013-04-08 DIAGNOSIS — R142 Eructation: Secondary | ICD-10-CM | POA: Insufficient documentation

## 2013-04-08 DIAGNOSIS — R350 Frequency of micturition: Secondary | ICD-10-CM | POA: Insufficient documentation

## 2013-04-08 DIAGNOSIS — Z3202 Encounter for pregnancy test, result negative: Secondary | ICD-10-CM | POA: Insufficient documentation

## 2013-04-08 DIAGNOSIS — R1011 Right upper quadrant pain: Secondary | ICD-10-CM | POA: Insufficient documentation

## 2013-04-08 DIAGNOSIS — J3489 Other specified disorders of nose and nasal sinuses: Secondary | ICD-10-CM | POA: Insufficient documentation

## 2013-04-08 DIAGNOSIS — R450 Nervousness: Secondary | ICD-10-CM | POA: Insufficient documentation

## 2013-04-08 DIAGNOSIS — R141 Gas pain: Secondary | ICD-10-CM | POA: Insufficient documentation

## 2013-04-08 DIAGNOSIS — N926 Irregular menstruation, unspecified: Secondary | ICD-10-CM | POA: Insufficient documentation

## 2013-04-08 DIAGNOSIS — R110 Nausea: Secondary | ICD-10-CM | POA: Insufficient documentation

## 2013-04-08 DIAGNOSIS — R109 Unspecified abdominal pain: Secondary | ICD-10-CM

## 2013-04-08 DIAGNOSIS — Z8619 Personal history of other infectious and parasitic diseases: Secondary | ICD-10-CM | POA: Insufficient documentation

## 2013-04-08 DIAGNOSIS — N939 Abnormal uterine and vaginal bleeding, unspecified: Secondary | ICD-10-CM | POA: Insufficient documentation

## 2013-04-08 DIAGNOSIS — F411 Generalized anxiety disorder: Secondary | ICD-10-CM | POA: Insufficient documentation

## 2013-04-08 LAB — CBC WITH DIFFERENTIAL/PLATELET
Basophils Absolute: 0 10*3/uL (ref 0.0–0.1)
Basophils Relative: 0 % (ref 0–1)
MCHC: 34.1 g/dL (ref 30.0–36.0)
Neutro Abs: 9.9 10*3/uL — ABNORMAL HIGH (ref 1.7–7.7)
Neutrophils Relative %: 72 % (ref 43–77)
RDW: 13.7 % (ref 11.5–15.5)
WBC: 13.7 10*3/uL — ABNORMAL HIGH (ref 4.0–10.5)

## 2013-04-08 LAB — URINALYSIS, ROUTINE W REFLEX MICROSCOPIC
Bilirubin Urine: NEGATIVE
Glucose, UA: NEGATIVE mg/dL
Ketones, ur: NEGATIVE mg/dL
Leukocytes, UA: NEGATIVE
pH: 5.5 (ref 5.0–8.0)

## 2013-04-08 LAB — COMPREHENSIVE METABOLIC PANEL
ALT: 13 U/L (ref 0–35)
AST: 17 U/L (ref 0–37)
Albumin: 3 g/dL — ABNORMAL LOW (ref 3.5–5.2)
Alkaline Phosphatase: 38 U/L — ABNORMAL LOW (ref 39–117)
Chloride: 108 mEq/L (ref 96–112)
Potassium: 4.5 mEq/L (ref 3.5–5.1)
Sodium: 138 mEq/L (ref 135–145)
Total Bilirubin: 0.2 mg/dL — ABNORMAL LOW (ref 0.3–1.2)

## 2013-04-08 MED ORDER — HYDROMORPHONE HCL PF 1 MG/ML IJ SOLN
0.5000 mg | Freq: Once | INTRAMUSCULAR | Status: AC
  Administered 2013-04-08: 0.5 mg via INTRAVENOUS
  Filled 2013-04-08 (×2): qty 1

## 2013-04-08 MED ORDER — HYDROMORPHONE HCL PF 1 MG/ML IJ SOLN
0.5000 mg | Freq: Once | INTRAMUSCULAR | Status: AC
  Administered 2013-04-08: 0.5 mg via INTRAVENOUS
  Filled 2013-04-08: qty 1

## 2013-04-08 MED ORDER — HYDROCODONE-ACETAMINOPHEN 5-300 MG PO TABS: 1.0000 | ORAL_TABLET | Freq: Four times a day (QID) | ORAL | Status: DC | PRN

## 2013-04-08 MED ORDER — ONDANSETRON HCL 4 MG/2ML IJ SOLN
4.0000 mg | Freq: Once | INTRAMUSCULAR | Status: AC
  Administered 2013-04-08: 4 mg via INTRAVENOUS
  Filled 2013-04-08: qty 2

## 2013-04-08 NOTE — ED Provider Notes (Addendum)
Patient was seen and evaluated. I reviewed and historical findings with Dr. Mikey Bussing . Examined the patient. . She's had pain for a week. No history of stones on CT. Episodes of cyclic pain for the last year. Most recently around her periods in June and July she describes episodes of pain the week before and the week after. Possibilities would include endometriosis, chronic undifferentiated pain. Rule out pregnancy. Rule out UTI she hasn't have symptoms of UTI. She has not have a gallbladder. Her pain is not right upper or  epigastric. This is not like the pain she had when she had H. pylori. She has had chronic recurrent episodes of pain and multiple workups.   Claudean Kinds, MD 04/08/13 1142  Claudean Kinds, MD 04/08/13 1218  A mild leukocytosis of 13,000. Her pain is slightly improved. She does not appear" she is not localized over her appendix. She still has an appetite. She is not pregnant. Her urine is not infected. I do not think imaging is at this time. No stigmata or findings or historical close it would suggest any I agree that this is appendicitis. He does run the room is an antalgic gait. She is hungry. I think the women's clinic referral for discussion regarding possible endometriosis as indicated. We'll give her a short supply of pain medication, and Careful precautions return here if pain is worse with ambulation riding in a car coughing breathing moving walking or any new symptoms.  Claudean Kinds, MD 04/08/13 1311

## 2013-04-08 NOTE — ED Notes (Signed)
Pt to ED for evaluation of right flank pain that has been persistent since Thursday, pain became sharp in nature last night.  Pt denies any changes in bowel or bladder.  Pt states she has an irregular period and has had increased vaginal discharge white in color.

## 2013-04-08 NOTE — ED Notes (Signed)
Rt sided abd pain x 1 week and some nausea

## 2013-04-08 NOTE — ED Provider Notes (Signed)
CSN: [id number]     Arrival date & time 04/08/13  1059 History     First MD Initiated Contact with Patient 04/08/13 1111     Chief Complaint  Patient presents with  . Abdominal Pain   (Consider location/radiation/quality/duration/timing/severity/associated sxs/prior Treatment) Patient is a 27 y.o. female presenting with abdominal pain.  Abdominal Pain Associated symptoms: nausea   Associated symptoms: no chest pain, no chills, no constipation, no diarrhea, no dysuria, no fatigue, no fever, no shortness of breath, no sore throat, no vaginal bleeding and no vomiting    Tynetta Bachmann is a 27 year old female who presents to [facility] with compliant of 1 week of right side abdominal pain.  She has a history of right side pelvic pain for which she has had multiple ED visits with follow up by GYN, she was diagnosed with a R ovarian cyst.  She reports this pain is a little different and more abdominal.  She also admits some nausea associated with the pain but no emesis.  She reports that eating food does not worsen the pain but that it makes her abdomen fill distended.  She reports these episodes of abdominal pain are cyclical and happen a few times a year.  She reports irregular periods and her birth control is by Nexplanon implant.  Her FDLMP was July 20th.  She is currently sexually active and does not use condoms.  Past Medical History  Diagnosis Date  . H pylori ulcer   . H. pylori infection    Past Surgical History  Procedure Laterality Date  . Cholecystectomy    . Cesarean section     Family History  Problem Relation Age of Onset  . Hypertension Mother   . Diabetes Maternal Grandmother   . Hypertension Maternal Grandmother    History  Substance Use Topics  . Smoking status: Current Every Day Smoker -- 0.50 packs/day for 10 years    Types: Cigarettes  . Smokeless tobacco: Never Used  . Alcohol Use: Yes     Comment: occasional   OB History   Grav Para Term Preterm Abortions TAB  SAB Ect Mult Living   1 1 1  0 0 0 0 0 0 1     Review of Systems  Constitutional: Negative for fever, chills, fatigue and unexpected weight change.  HENT: Positive for congestion. Negative for sore throat, sneezing and sinus pressure.   Eyes: Negative for visual disturbance.  Respiratory: Negative for chest tightness and shortness of breath.   Cardiovascular: Negative for chest pain and palpitations.  Gastrointestinal: Positive for nausea, abdominal pain and abdominal distention. Negative for vomiting, diarrhea and constipation.  Endocrine: Negative for polyuria.  Genitourinary: Positive for frequency and menstrual problem. Negative for dysuria, vaginal bleeding, difficulty urinating, pelvic pain and dyspareunia.  Musculoskeletal: Negative for back pain and arthralgias.  Skin: Negative for rash and wound.  Neurological: Negative for dizziness and headaches.  Hematological: Negative for adenopathy.  Psychiatric/Behavioral: The patient is nervous/anxious.     Allergies  Morphine and related  Home Medications   Current Outpatient Rx  Name  Route  Sig  Dispense  Refill  . etonogestrel (IMPLANON) 68 MG IMPL implant   Subcutaneous   Inject 1 each into the skin once.         Marland Kitchen HYDROmorphone (DILAUDID) 2 MG tablet   Oral   Take 1 tablet (2 mg total) by mouth every 6 (six) hours as needed for pain.   10 tablet   0   .  ibuprofen (ADVIL,MOTRIN) 600 MG tablet   Oral   Take 1 tablet (600 mg total) by mouth every 6 (six) hours as needed for pain.   20 tablet   0   . oxyCODONE-acetaminophen (PERCOCET/ROXICET) 5-325 MG per tablet   Oral   Take 1 tablet by mouth every 4 (four) hours as needed for pain.         Marland Kitchen oxyCODONE-acetaminophen (PERCOCET/ROXICET) 5-325 MG per tablet   Oral   Take 1 tablet by mouth every 4 (four) hours as needed for pain.   15 tablet   0    BP 119/80  Pulse 80  Temp(Src) 98.1 F (36.7 C) (Oral)  Resp 19  SpO2 97% Physical Exam  Nursing note and  vitals reviewed. Constitutional: She is oriented to person, place, and time. She appears well-developed and well-nourished.  HENT:  Head: Normocephalic and atraumatic.  Eyes: Conjunctivae and EOM are normal.  Neck: Neck supple.  Cardiovascular: Normal rate, regular rhythm and intact distal pulses.   No murmur heard. Pulmonary/Chest: Effort normal and breath sounds normal. No respiratory distress. She has no wheezes. She has no rales.  Abdominal: Soft. Bowel sounds are normal. She exhibits no distension. There is tenderness (mild RUQ TTP). There is no rebound and no guarding.  Musculoskeletal: She exhibits no edema.  Neurological: She is alert and oriented to person, place, and time.  Skin: Skin is warm and dry. No erythema.  Psychiatric: She has a normal mood and affect. Her behavior is normal.    ED Course   Procedures (including critical care time)  Labs Reviewed  CBC WITH DIFFERENTIAL - Abnormal; Notable for the following:    WBC 13.7 (*)    Neutro Abs 9.9 (*)    All other components within normal limits  COMPREHENSIVE METABOLIC PANEL - Abnormal; Notable for the following:    Glucose, Bld 100 (*)    Calcium 8.3 (*)    Total Protein 5.3 (*)    Albumin 3.0 (*)    Alkaline Phosphatase 38 (*)    Total Bilirubin 0.2 (*)    All other components within normal limits  LIPASE, BLOOD  URINALYSIS, ROUTINE W REFLEX MICROSCOPIC  PREGNANCY, URINE   No results found. 1. Abdominal pain in female patient     MDM  27 year old female with PMH of cholecystectomy, C- section, who presents to the ED with complaints of RUQ abdominal pain.  Patient has had multiple ED visits and outpatient workup for cyclical pains without success finding source.  She has been treated for H Pylori infections and found to have a right ovarian cyst. -CMP- wnl -CBC w diff, mild leukocytosis no identified cause, may be secondary to pain -Lipase- neg -Upreg-neg -UA-neg -Will give patient zofran and dilaudid to  control symptoms.  If workup is negative patient may need to be referred for endometriosis workup.    Patient was told that her pain is unlikely to be due to appendicitis since it has been present for over 1 week, she has no fever or chills, and she is not very tender in her RLQ.  She was told that we would have to do a CT scan to rule out completely and she reported that she did not wish to have CT. Patient reported that her pain has decreased and is no longer nauseous.  Patient instructed to follow up at [facility] to possibly be evaluated for endometriosis and further work up for abdominal pain.  Patient was given limited  prescription of Vicodin to take for pain and instructed she can take OTC NSAIDs, patient was given return precautions.  Carlynn Purl, DO 04/08/13 1944

## 2013-04-09 NOTE — ED Provider Notes (Signed)
Medical screening examination/treatment/procedure(s) were performed by non-physician practitioner and as supervising physician I was immediately available for consultation/collaboration.   Lehua Flores Joseph Nate Perri, MD 04/09/13 1107 

## 2013-04-14 ENCOUNTER — Encounter: Payer: Self-pay | Admitting: Obstetrics

## 2013-04-14 ENCOUNTER — Ambulatory Visit (INDEPENDENT_AMBULATORY_CARE_PROVIDER_SITE_OTHER): Payer: Medicaid Other | Admitting: Obstetrics

## 2013-04-14 VITALS — BP 123/79 | HR 75 | Temp 97.8°F | Ht 65.0 in | Wt 248.0 lb

## 2013-04-14 DIAGNOSIS — R102 Pelvic and perineal pain: Secondary | ICD-10-CM

## 2013-04-14 DIAGNOSIS — N83209 Unspecified ovarian cyst, unspecified side: Secondary | ICD-10-CM

## 2013-04-14 DIAGNOSIS — R1031 Right lower quadrant pain: Secondary | ICD-10-CM

## 2013-04-14 DIAGNOSIS — N949 Unspecified condition associated with female genital organs and menstrual cycle: Secondary | ICD-10-CM

## 2013-04-14 MED ORDER — OXYCODONE-ACETAMINOPHEN 10-325 MG PO TABS: 1.0000 | ORAL_TABLET | ORAL | Status: DC | PRN

## 2013-04-14 MED ORDER — KETOROLAC TROMETHAMINE 60 MG/2ML IM SOLN
60.0000 mg | Freq: Once | INTRAMUSCULAR | Status: AC
  Administered 2013-04-14: 60 mg via INTRAMUSCULAR

## 2013-04-14 NOTE — Progress Notes (Signed)
Subjective:     [name] is a 27 y.o. female here for a routine exam.  Current complaints: lower abdomen pain and into lower back. Pt states it is a constant sharp shooting pain. Pt states laying down makes it worse. Pt states nothing relieves the pain. Pt states she has tried tylenol, ibuprofen, warm baths and heating pad without relief.  Personal health questionnaire reviewed: yes.   Gynecologic History No LMP recorded. Patient has had an implant. Contraception: Nexplanon Last Pap: 2013. Results were: normal   Obstetric History OB History  Gravida Para Term Preterm AB SAB TAB Ectopic Multiple Living  1 1 1  0 0 0 0 0 0 1    # Outcome Date GA Lbr Len/2nd Weight Sex Delivery Anes PTL Lv  1 TRM 12/06/06 [redacted]w[redacted]d  8 lb 8 oz (3.856 kg) M CS          The following portions of the patient's history were reviewed and updated as appropriate: allergies, current medications, past family history, past medical history, past social history, past surgical history and problem list.  Review of Systems Pertinent items are noted in HPI.    Objective:    General appearance: alert and no distress Abdomen: normal findings: soft, non-tender Pelvic: cervix normal in appearance, external genitalia normal, no adnexal masses or tenderness, no cervical motion tenderness, uterus normal size, shape, and consistency and vagina normal without discharge    Assessment:    Pelvic pain.   Plan:    Education reviewed: management of pelvic pain.  Ultrasound ordered.

## 2013-04-15 LAB — WET PREP BY MOLECULAR PROBE
Gardnerella vaginalis: NEGATIVE
Trichomonas vaginosis: NEGATIVE

## 2013-04-20 ENCOUNTER — Other Ambulatory Visit: Payer: Medicaid Other

## 2013-04-20 ENCOUNTER — Ambulatory Visit (INDEPENDENT_AMBULATORY_CARE_PROVIDER_SITE_OTHER): Payer: Medicaid Other

## 2013-04-20 DIAGNOSIS — N83209 Unspecified ovarian cyst, unspecified side: Secondary | ICD-10-CM

## 2013-04-20 DIAGNOSIS — R1031 Right lower quadrant pain: Secondary | ICD-10-CM

## 2013-04-20 DIAGNOSIS — N949 Unspecified condition associated with female genital organs and menstrual cycle: Secondary | ICD-10-CM

## 2013-04-21 IMAGING — US US TRANSVAGINAL NON-OB
1 series · 14 of 25 positions shown · non-contrast
Comparison: 11/09/2012.

CLINICAL DATA: Pelvic pain.  History of ovarian cyst.

TRANSVAGINAL ULTRASOUND OF PELVIS
TECHNIQUE: Transvaginal ultrasound examination of the pelvis was
performed including evaluation of the uterus, ovaries, adnexal
regions, and pelvic cul-de-sac.

[Series 1: us transvaginal non-ob · 14 of 37 slices shown]
[im 1/37]
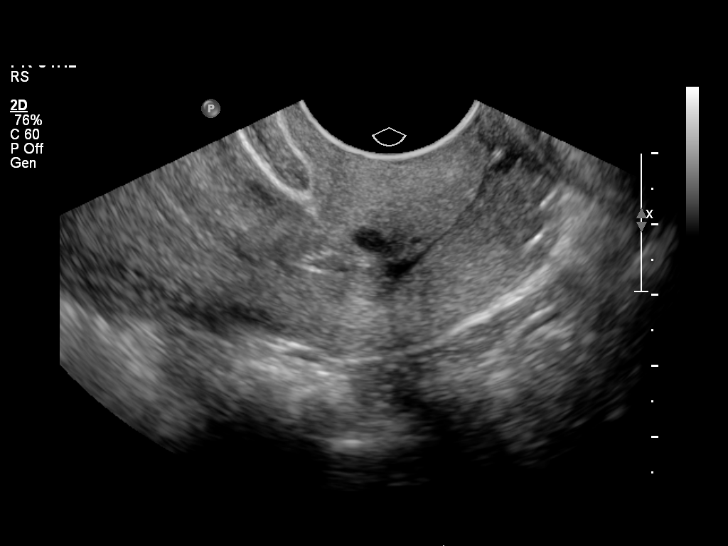
[im 4/37]
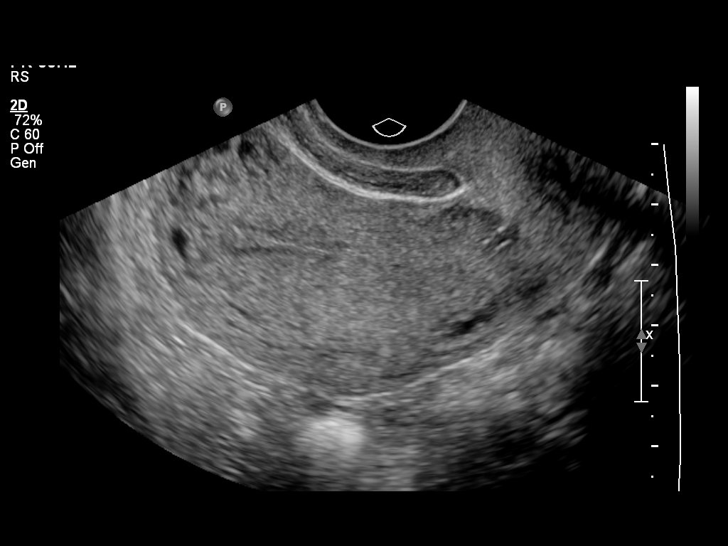
[im 7/37]
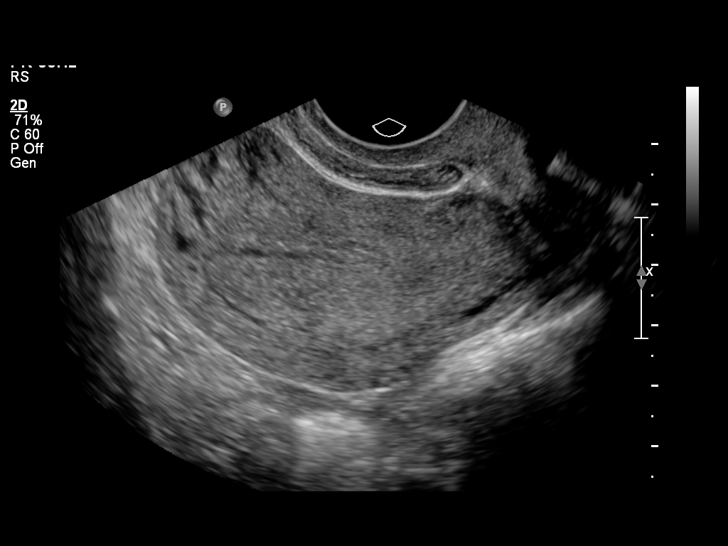
[im 10/37]
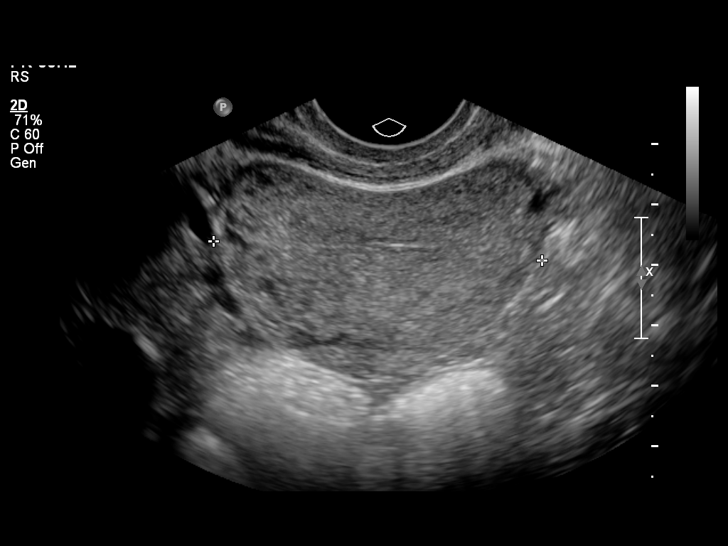
[im 13/37]
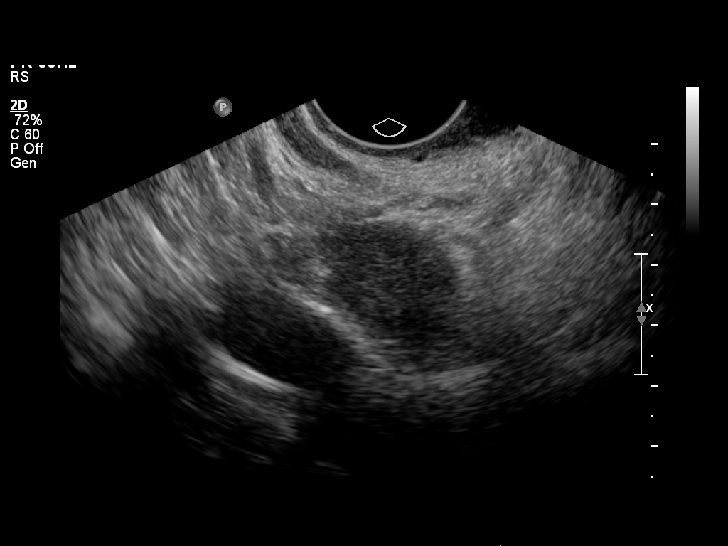
[im 14/37]
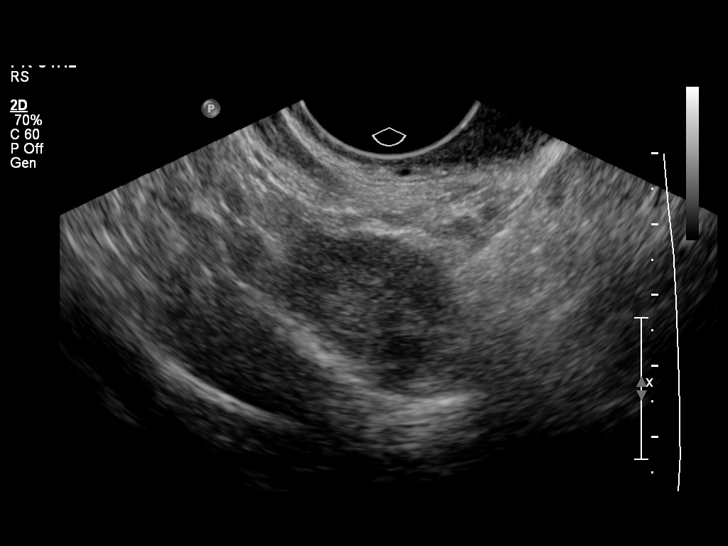
[im 17/37]
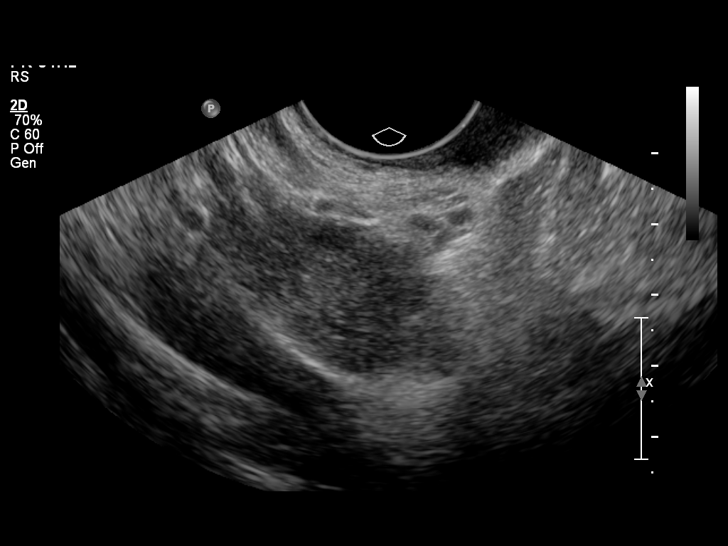
[im 20/37]
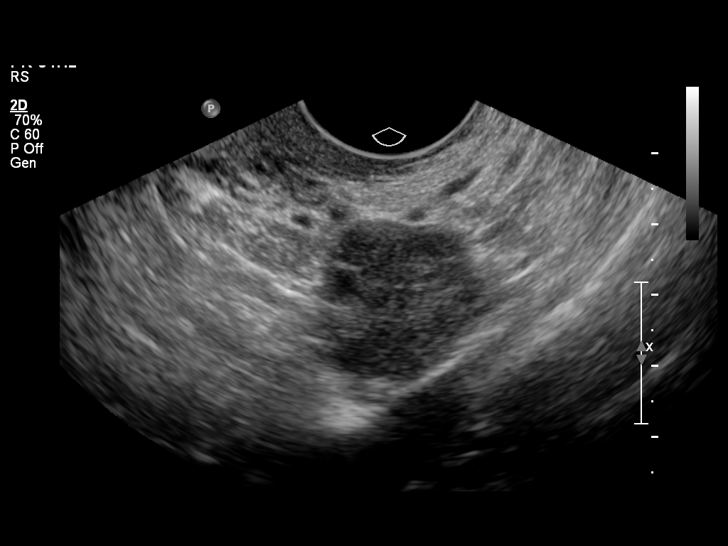
[im 23/37]
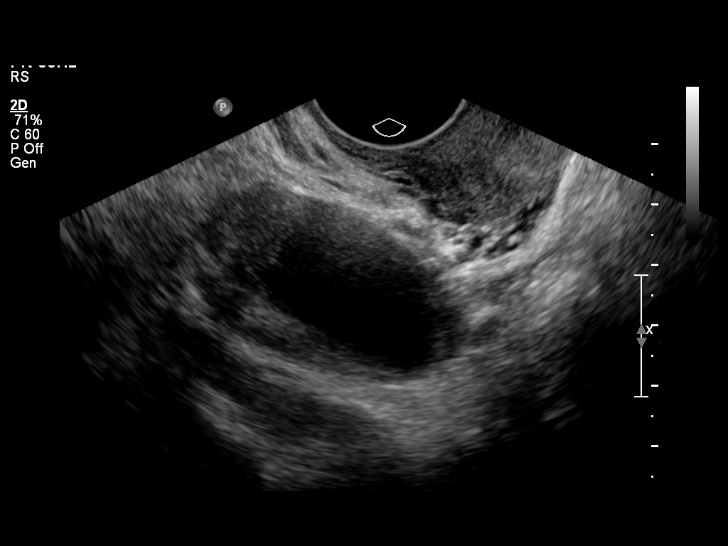
[im 25/37]
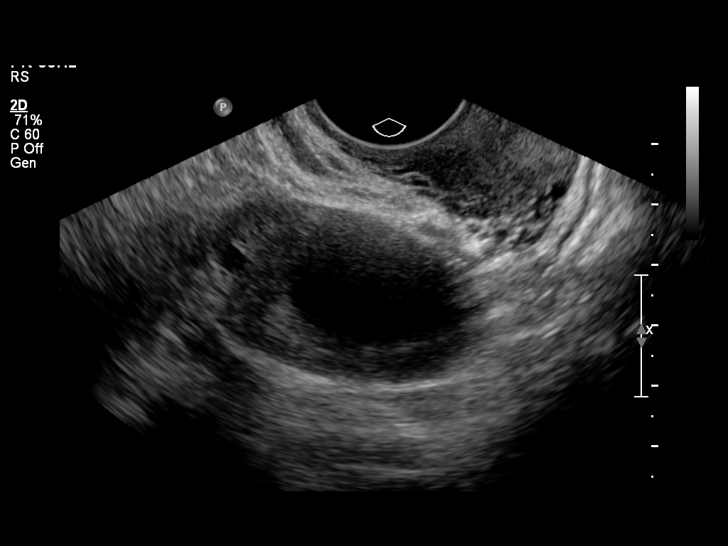
[im 28/37]
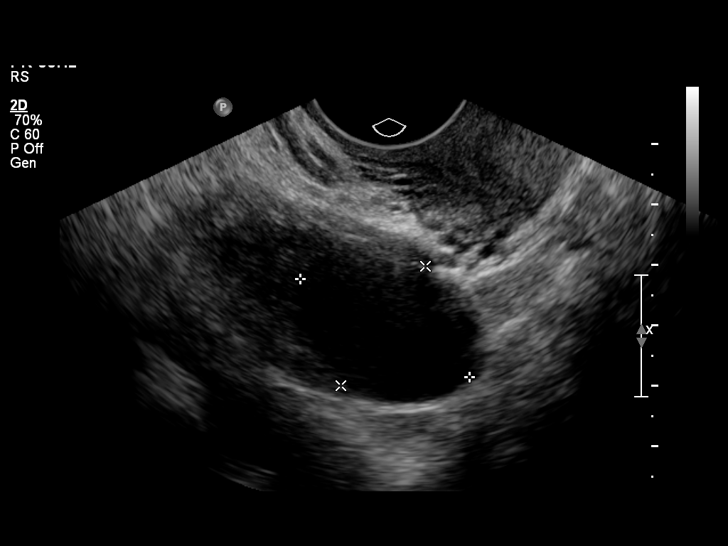
[im 31/37]
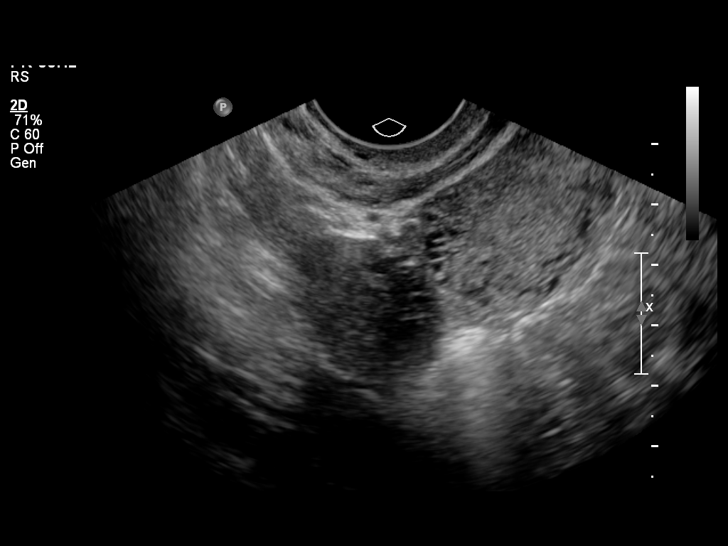
[im 34/37]
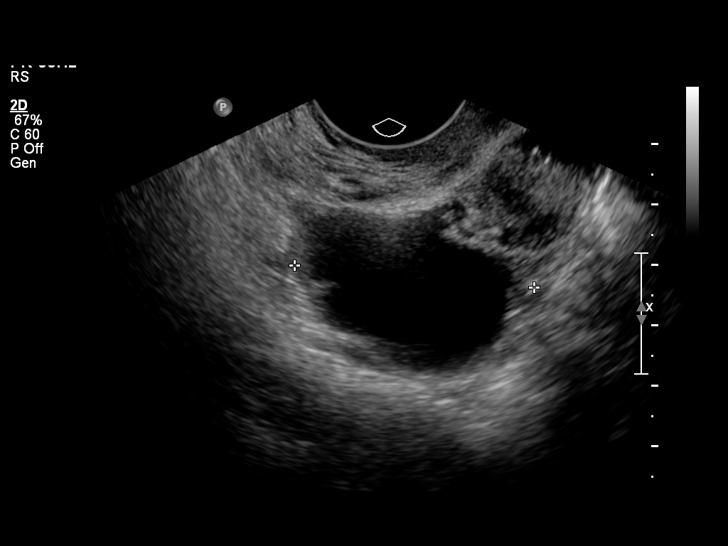
[im 37/37]
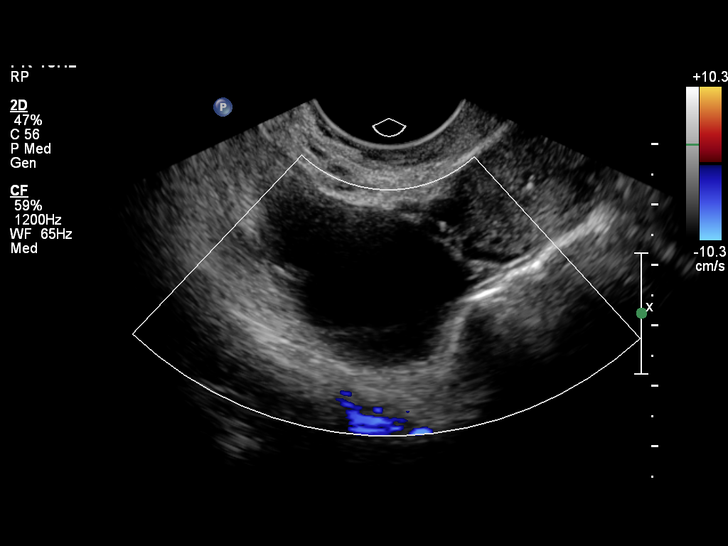

[14 of 25 positions shown; findings below may reference images not displayed]

FINDINGS: Uterus:  Normal in size and echotexture measuring 8.9 x 8.4 x
cm.  No focal cystic or solid uterine lesions.

Endometrium: Thin endometrium measuring only 4 mm.

Right ovary: Right ovary measures 4.9 x 2.5 x 4.0 cm.  There is an
anechoic lesion with increased through transmission and no internal
blood flow or internal architecture measuring 3.2 x 2.4 x 3.2 cm,
compatible with a cyst.

Left ovary: Normal in echotexture and appearance measuring 3.0 x
2.2 x 2.4 cm.  No focal cystic or solid lesions.  Multiple small
follicles.

Other Findings:  No significant free fluid within the cul-de-sac.
IMPRESSION: 1.  No acute findings to account for the patient's symptoms.
2.  3.2 x 2.4 x 3.2 cm simple cyst in the right ovary.

## 2013-04-27 ENCOUNTER — Ambulatory Visit (INDEPENDENT_AMBULATORY_CARE_PROVIDER_SITE_OTHER): Payer: Medicaid Other | Admitting: Obstetrics

## 2013-04-27 VITALS — BP 130/81 | HR 55 | Temp 98.0°F | Ht 65.0 in | Wt 245.0 lb

## 2013-04-27 DIAGNOSIS — N949 Unspecified condition associated with female genital organs and menstrual cycle: Secondary | ICD-10-CM

## 2013-04-27 DIAGNOSIS — R102 Pelvic and perineal pain: Secondary | ICD-10-CM

## 2013-04-27 MED ORDER — OXYCODONE-ACETAMINOPHEN 10-325 MG PO TABS: 1.0000 | ORAL_TABLET | ORAL | Status: DC | PRN

## 2013-04-27 MED ORDER — IBUPROFEN 800 MG PO TABS: 800.0000 mg | ORAL_TABLET | Freq: Three times a day (TID) | ORAL | Status: DC | PRN

## 2013-04-27 NOTE — Progress Notes (Signed)
Subjective:     [name] is a 27 y.o. female here for a routine exam.  Current complaints: pt her today for a follow up exam. Pt states she an ultrasound performed 04-20-13. Here to review results.   Personal health questionnaire reviewed: yes.   Gynecologic History No LMP recorded. Patient has had an implant. Contraception: Nexplanon   Obstetric History OB History  Gravida Para Term Preterm AB SAB TAB Ectopic Multiple Living  1 1 1  0 0 0 0 0 0 1    # Outcome Date GA Lbr Len/2nd Weight Sex Delivery Anes PTL Lv  1 TRM 12/06/06 [redacted]w[redacted]d  8 lb 8 oz (3.856 kg) M CS          The following portions of the patient's history were reviewed and updated as appropriate: allergies, current medications, past family history, past medical history, past social history, past surgical history and problem list.  Review of Systems Pertinent items are noted in HPI.    Objective:    General appearance: alert and no distress Abdomen: normal findings: soft, non-tender Pelvic: cervix normal in appearance, external genitalia normal, no cervical motion tenderness, positive findings: tenderness of bilateral adnexa(e), rectovaginal septum normal, uterus normal size, shape, and consistency and vagina normal without discharge    Assessment:    Pelvic pain.  Ultrasound WNL's with bilateral physiologic ovarian cysts.   Plan:    Education reviewed: safe sex/STD prevention and pelvic pain.    OCP"s dispensed x 3 months.  F/U 3 months.  May need referral to [facility] if no resolution.

## 2013-04-28 ENCOUNTER — Encounter: Payer: Self-pay | Admitting: Obstetrics

## 2013-05-11 ENCOUNTER — Emergency Department (HOSPITAL_COMMUNITY)
Admission: EM | Admit: 2013-05-11 | Discharge: 2013-05-11 | Disposition: A | Discharge status: Home / Self Care | Payer: Medicaid Other | Source: Home / Self Care | Attending: Family Medicine | Admitting: Family Medicine

## 2013-05-11 ENCOUNTER — Encounter (HOSPITAL_COMMUNITY): Payer: Self-pay

## 2013-05-11 ENCOUNTER — Telehealth: Payer: Self-pay | Admitting: Hematology & Oncology

## 2013-05-11 ENCOUNTER — Emergency Department (INDEPENDENT_AMBULATORY_CARE_PROVIDER_SITE_OTHER): Payer: Federal, State, Local not specified - Other

## 2013-05-11 DIAGNOSIS — R1011 Right upper quadrant pain: Secondary | ICD-10-CM

## 2013-05-11 DIAGNOSIS — G8929 Other chronic pain: Secondary | ICD-10-CM

## 2013-05-11 DIAGNOSIS — K2970 Gastritis, unspecified, without bleeding: Secondary | ICD-10-CM

## 2013-05-11 LAB — COMPREHENSIVE METABOLIC PANEL
AST: 15 U/L (ref 0–37)
Albumin: 3.5 g/dL (ref 3.5–5.2)
BUN: 9 mg/dL (ref 6–23)
Chloride: 105 mEq/L (ref 96–112)
Creatinine, Ser: 0.74 mg/dL (ref 0.50–1.10)
Total Protein: 6.5 g/dL (ref 6.0–8.3)

## 2013-05-11 LAB — POCT I-STAT, CHEM 8
BUN: 8 mg/dL (ref 6–23)
Chloride: 108 mEq/L (ref 96–112)
Creatinine, Ser: 0.8 mg/dL (ref 0.50–1.10)
Glucose, Bld: 95 mg/dL (ref 70–99)
Hemoglobin: 14.3 g/dL (ref 12.0–15.0)
Potassium: 3.9 mEq/L (ref 3.5–5.1)
Sodium: 140 mEq/L (ref 135–145)

## 2013-05-11 LAB — POCT URINALYSIS DIP (DEVICE)
Hgb urine dipstick: NEGATIVE
Nitrite: NEGATIVE
Urobilinogen, UA: 0.2 mg/dL (ref 0.0–1.0)
pH: 7 (ref 5.0–8.0)

## 2013-05-11 MED ORDER — OMEPRAZOLE 20 MG PO CPDR: DELAYED_RELEASE_CAPSULE | ORAL | Status: DC

## 2013-05-11 MED ORDER — ONDANSETRON HCL 4 MG PO TABS: 4.0000 mg | ORAL_TABLET | Freq: Two times a day (BID) | ORAL | Status: DC | PRN

## 2013-05-11 MED ORDER — ONDANSETRON 4 MG PO TBDP
ORAL_TABLET | ORAL | Status: AC
  Filled 2013-05-11: qty 1

## 2013-05-11 MED ORDER — ONDANSETRON 4 MG PO TBDP
4.0000 mg | ORAL_TABLET | Freq: Once | ORAL | Status: AC
  Administered 2013-05-11: 4 mg via ORAL

## 2013-05-11 NOTE — Telephone Encounter (Signed)
Left message to call for appointment. Kelly aware to call pt to try to schedule appointment

## 2013-05-11 NOTE — ED Provider Notes (Signed)
CSN: [id number]     Arrival date & time 05/11/13  4098 History   First MD Initiated Contact with Patient 05/11/13 0935     Chief Complaint  Patient presents with  . Flank Pain   (Consider location/radiation/quality/duration/timing/severity/associated sxs/prior Treatment) HPI Comments: 27 year old smoker female status post cholecystectomy here complaining of right upper quadrant and right flank pain associated with nausea for 2 days. Patient reports she had similar episodes in the past. Denies jaundice. Patient also reports epigastric burning sensation for the last 2-3 days. Patient reports nausea and vomiting about half an hour after ingesting solids in the last 2 days. She was seen over a week ago by her GYN for pelvic pain was diagnosed with functional ovarian cysts she has been taking oral contraceptive pills to help with her symptoms plus patient being already on a hormonal subcutaneous implant for birth control. She's also been taking ibuprofen 800 mg frequently and has taken a narcotic for pelvic pain during last week. Has not taken any medications for nausea. Denies diarrhea although she reports frequent loose stools had a normal bowel movement this morning. States blood in the stools in the past with normal colonoscopy in january this year as per her report. Denies current pelvic pain or vaginal discharge. Denies vaginal bleeding. Denies dysuria, urinary frequency or hematuria. She had a abdominal CT scan on April 4 for similar symptoms with no abnormal findings.   Past Medical History  Diagnosis Date  . H pylori ulcer   . H. pylori infection    Past Surgical History  Procedure Laterality Date  . Cholecystectomy    . Cesarean section     Family History  Problem Relation Age of Onset  . Hypertension Mother   . Diabetes Maternal Grandmother   . Hypertension Maternal Grandmother    History  Substance Use Topics  . Smoking status: Current Every Day Smoker -- 0.50 packs/day for 10  years    Types: Cigarettes  . Smokeless tobacco: Never Used  . Alcohol Use: Yes     Comment: occasional   OB History   Grav Para Term Preterm Abortions TAB SAB Ect Mult Living   1 1 1  0 0 0 0 0 0 1     Review of Systems  Constitutional: Negative for fever, chills, diaphoresis and fatigue.  HENT: Positive for congestion and rhinorrhea. Negative for sore throat.   Respiratory: Negative for cough, shortness of breath and wheezing.   Gastrointestinal: Positive for nausea, vomiting and abdominal pain. Negative for diarrhea and constipation.  Endocrine: Negative for polydipsia, polyphagia and polyuria.  Genitourinary: Positive for flank pain. Negative for dysuria, urgency, frequency, hematuria, vaginal bleeding, vaginal discharge and pelvic pain.  Musculoskeletal: Negative for back pain.  All other systems reviewed and are negative.    Allergies  Morphine and related  Home Medications   Current Outpatient Rx  Name  Route  Sig  Dispense  Refill  . cholecalciferol (VITAMIN D) 1000 UNITS tablet   Oral   Take 1,000 Units by mouth daily.         Marland Kitchen etonogestrel (IMPLANON) 68 MG IMPL implant   Subcutaneous   Inject 68 mg into the skin once. Implanted September 2012         . ibuprofen (ADVIL,MOTRIN) 800 MG tablet   Oral   Take 1 tablet (800 mg total) by mouth every 8 (eight) hours as needed for pain.   30 tablet   5   . oxyCODONE-acetaminophen (PERCOCET) 10-325 MG per  tablet   Oral   Take 1 tablet by mouth every 4 (four) hours as needed for pain.   40 tablet   0   . omeprazole (PRILOSEC) 20 MG capsule      1 tab by mouth twice a day for 1 week then 1 tab daily when necessary.   30 capsule   0   . ondansetron (ZOFRAN) 4 MG tablet   Oral   Take 1 tablet (4 mg total) by mouth every 12 (twelve) hours as needed for nausea.   12 tablet   0    BP 118/66  Pulse 74  Temp(Src) 98.1 F (36.7 C) (Oral)  Resp 16 Physical Exam  Nursing note and vitals  reviewed. Constitutional: She is oriented to person, place, and time. She appears well-developed and well-nourished. No distress.  obese  HENT:  Head: Normocephalic and atraumatic.  Mouth/Throat: Oropharynx is clear and moist. No oropharyngeal exudate.  Eyes: Conjunctivae are normal. No scleral icterus.  Neck: Neck supple. No thyromegaly present.  Cardiovascular: Regular rhythm and normal heart sounds.   Pulmonary/Chest: Breath sounds normal. No respiratory distress. She has no wheezes. She has no rales.  Abdominal: Soft. Bowel sounds are normal. She exhibits no distension and no mass. There is no rebound and no guarding.  Obese. Tenderness to epigastric area with deep palpation.  Reported RUQ a discomfort with deep palpation, no rebound. Liver not palpable. No guarding. No CVT.  Lymphadenopathy:    She has no cervical adenopathy.  Neurological: She is alert and oriented to person, place, and time.  Skin: No rash noted. She is not diaphoretic.    ED Course  Procedures (including critical care time) Labs Review Labs Reviewed  COMPREHENSIVE METABOLIC PANEL  POCT URINALYSIS DIP (DEVICE)  POCT PREGNANCY, URINE  POCT I-STAT, CHEM 8   Imaging Review Dg Abd 1 View  05/11/2013   CLINICAL DATA:  Right flank pain, nausea, vomiting.  EXAM: ABDOMEN - 1 VIEW  COMPARISON:  CT 11/26/2012  FINDINGS: The bowel gas pattern is normal. No radio-opaque calculi or other significant radiographic abnormality are seen.  IMPRESSION: Negative.   Electronically Signed   By: Charlett Nose M.D.   On: 05/11/2013 10:50    MDM   1. Gastritis   2. Abdominal pain, chronic, right upper quadrant    Normal KUB. Normal urinalysis. Negative pregnancy urine. Findings on examination not suggestive of acute abdomen. Impress gastritis related to recent frequent ingestion of ibuprofen and also been on oral contraceptive pills plus birth control implant. Complete metabolic panel pending at the time of discharge but I  stat normal. Prescribed ondansetron and Prilosec. Recommended to stop taking ibuprofen. Symptoms improved here after administration of ondansetron and patient tolerating fluids with no vomiting. Followup with primary care provider to monitor symptoms or go to the emergency department if worsening symptoms despite following treatment. Supportive care and red flags that should prompt her return to medical attention discussed with patient and provided in writing.    Sharin Grave, MD 05/11/13 1257

## 2013-05-11 NOTE — ED Notes (Signed)
C/o pain right flank past 2 days, no change UA, but  reportedly had blood in stool

## 2013-06-01 ENCOUNTER — Encounter: Payer: Self-pay | Admitting: Obstetrics

## 2013-06-14 ENCOUNTER — Encounter: Payer: Self-pay | Admitting: Obstetrics

## 2013-06-21 ENCOUNTER — Telehealth: Payer: Self-pay | Admitting: Hematology & Oncology

## 2013-06-21 NOTE — Telephone Encounter (Signed)
Patient called and cx 06/22/13 apt due to not being in town.  She will call back to resch

## 2013-06-21 NOTE — Telephone Encounter (Signed)
Left vm w NEW PATIENT today to remind them of their appointment with Dr. Ennever. Also, advised them to bring all meds and insurance information. ° °

## 2013-06-22 ENCOUNTER — Other Ambulatory Visit: Payer: Self-pay | Admitting: Lab

## 2013-06-22 ENCOUNTER — Ambulatory Visit: Payer: Self-pay | Admitting: Hematology & Oncology

## 2013-06-22 ENCOUNTER — Ambulatory Visit: Payer: Self-pay

## 2013-06-30 ENCOUNTER — Other Ambulatory Visit: Payer: Self-pay

## 2013-07-27 ENCOUNTER — Ambulatory Visit: Payer: Medicaid Other | Admitting: Obstetrics

## 2013-10-10 IMAGING — CR DG ABDOMEN 1V
2 series · 2 of 2 positions shown · non-contrast
Comparison: CT 11/26/2012

CLINICAL DATA: Right flank pain, nausea, vomiting.

EXAM:
ABDOMEN - 1 VIEW

[view not recorded (1 of 2)]
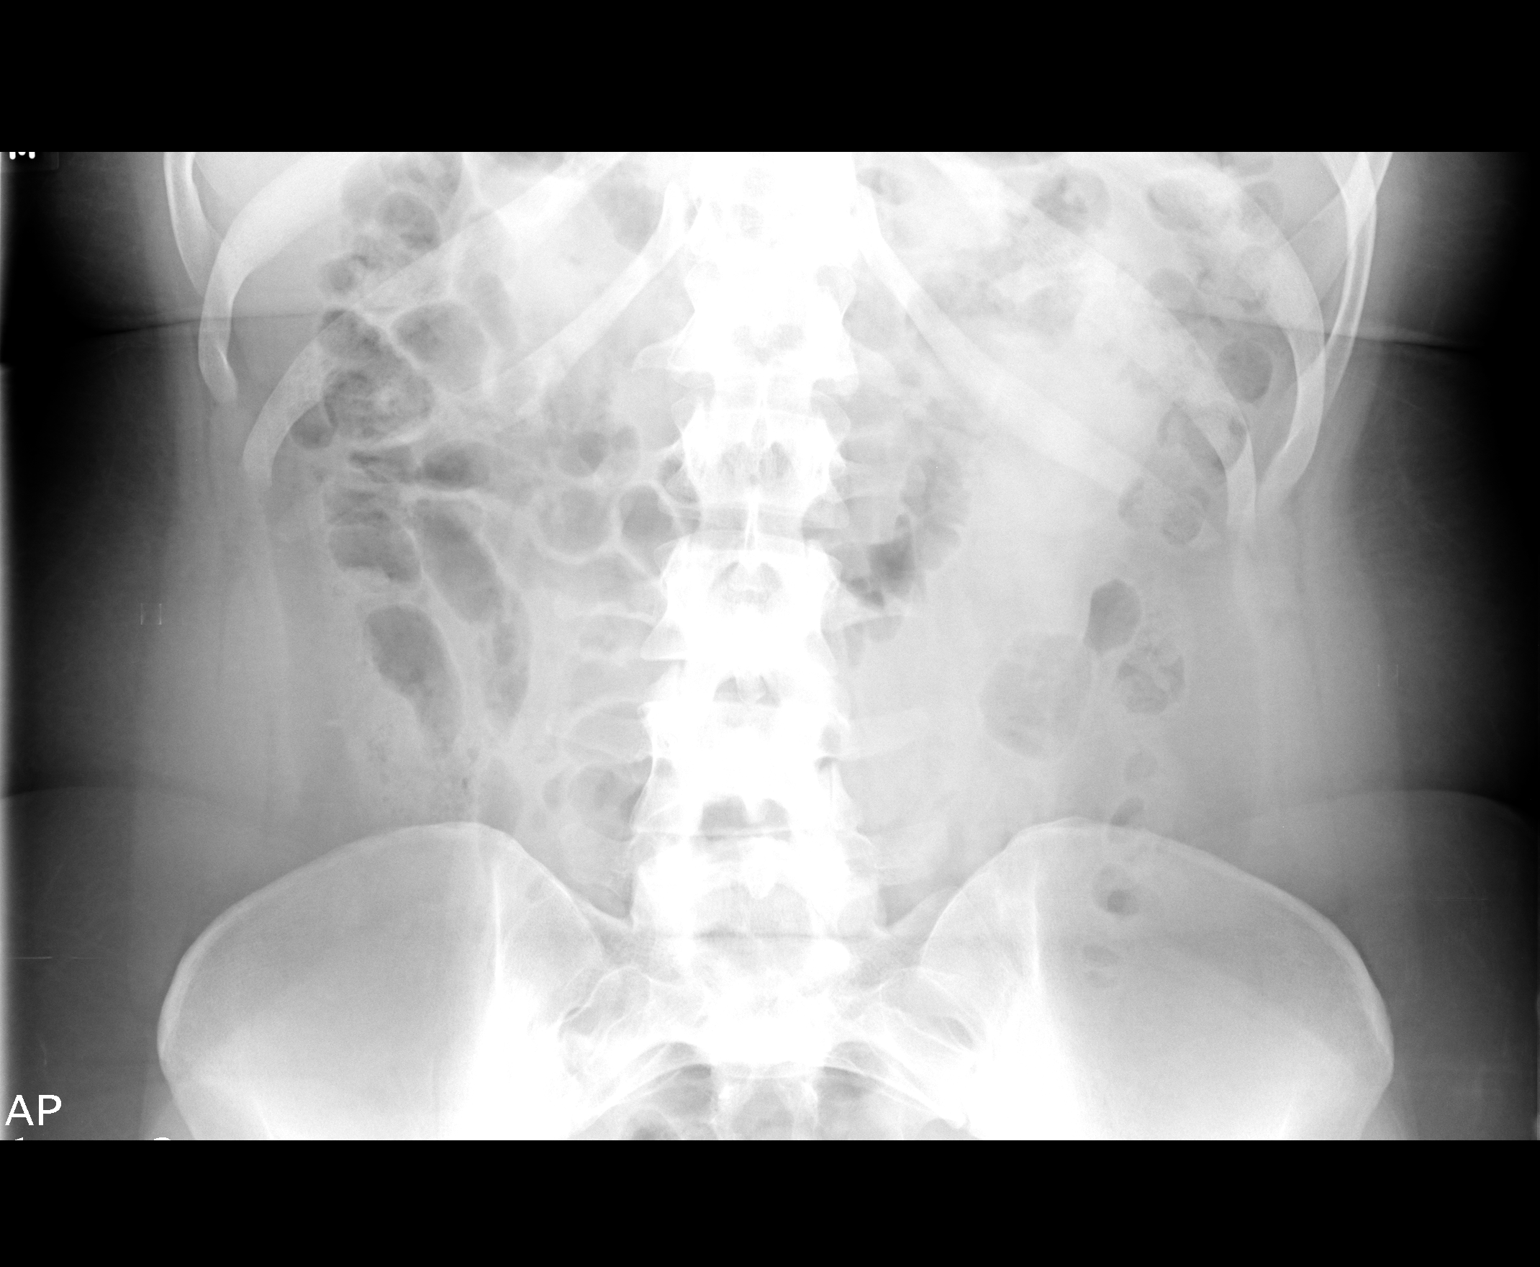

[view not recorded (2 of 2)]
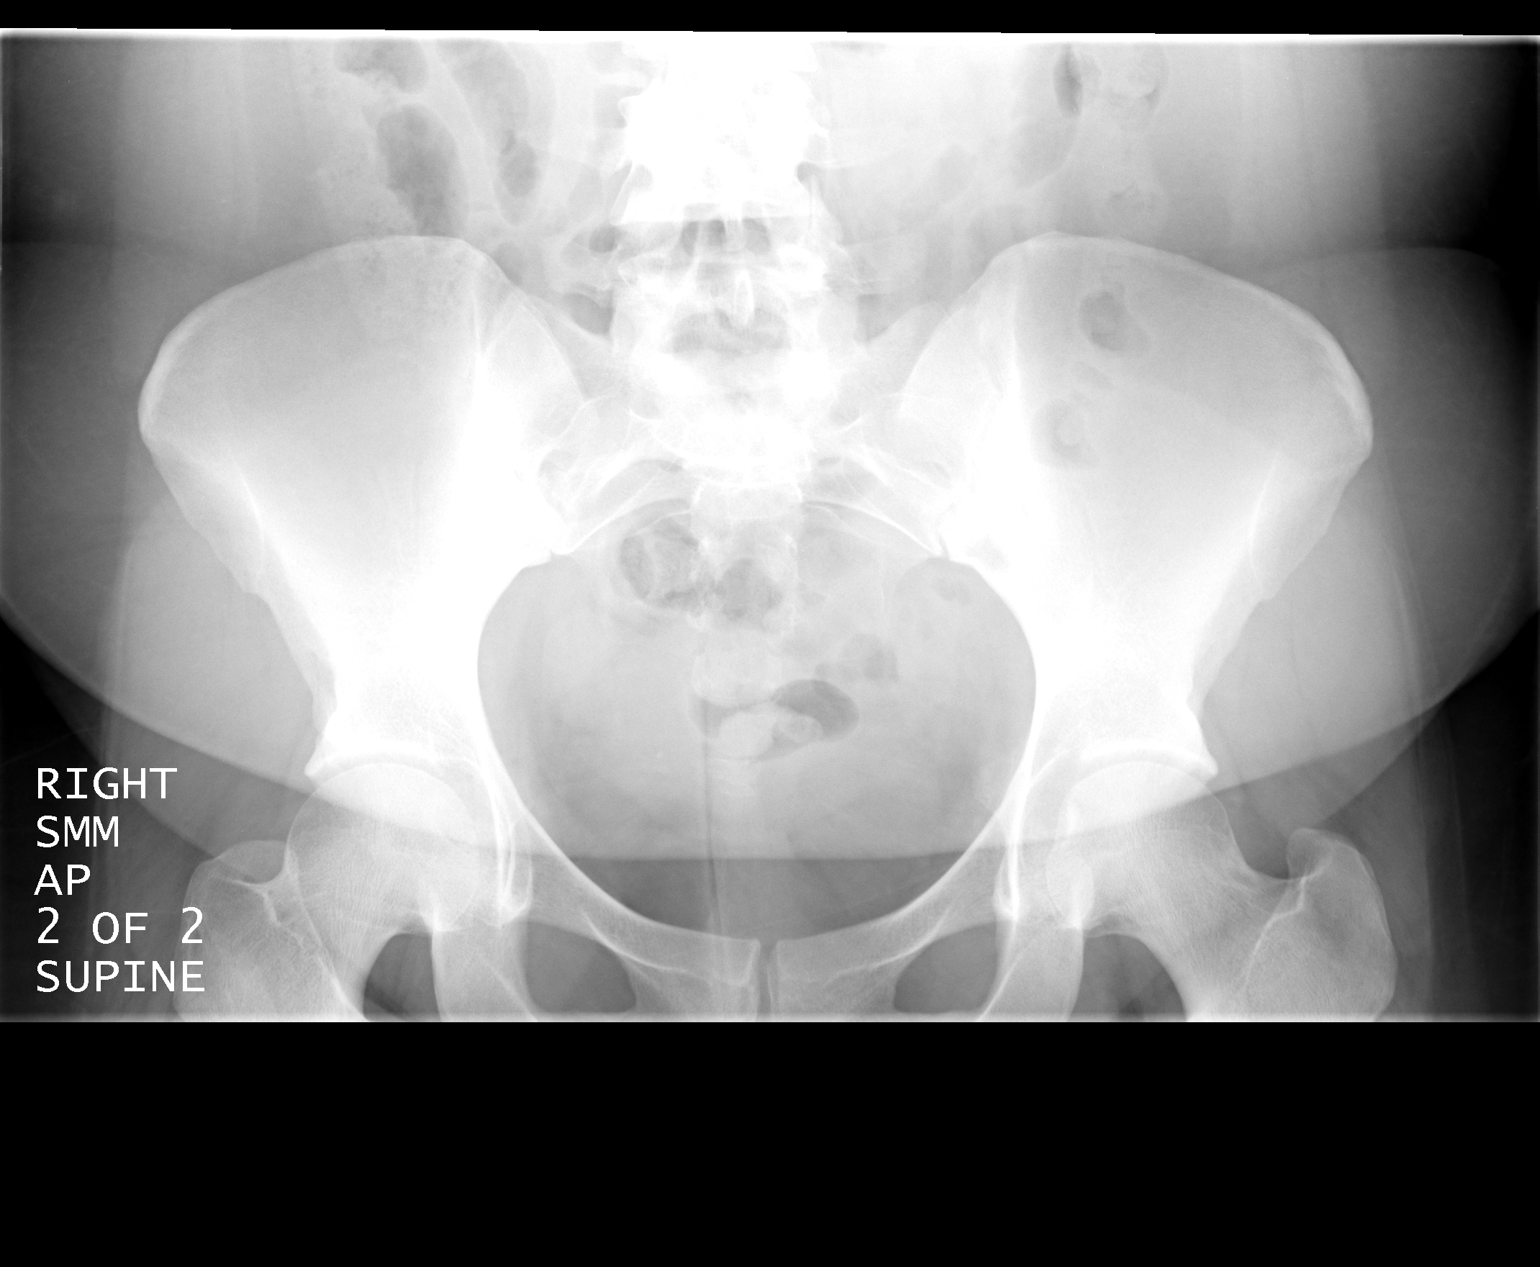

[2 of 2 positions shown; findings below may reference images not displayed]

FINDINGS: The bowel gas pattern is normal. No radio-opaque calculi or other
significant radiographic abnormality are seen.
IMPRESSION: Negative.

## 2014-06-09 ENCOUNTER — Other Ambulatory Visit: Payer: Self-pay

## 2014-06-26 ENCOUNTER — Encounter (HOSPITAL_COMMUNITY): Payer: Self-pay

## 2019-02-08 DIAGNOSIS — J209 Acute bronchitis, unspecified: Secondary | ICD-10-CM | POA: Diagnosis not present

## 2019-02-08 DIAGNOSIS — R0602 Shortness of breath: Secondary | ICD-10-CM | POA: Diagnosis not present

## 2019-05-04 DIAGNOSIS — U071 COVID-19: Secondary | ICD-10-CM | POA: Insufficient documentation

## 2019-12-06 DIAGNOSIS — Z3009 Encounter for other general counseling and advice on contraception: Secondary | ICD-10-CM | POA: Diagnosis not present

## 2019-12-06 DIAGNOSIS — Z3046 Encounter for surveillance of implantable subdermal contraceptive: Secondary | ICD-10-CM | POA: Diagnosis not present

## 2020-07-13 ENCOUNTER — Other Ambulatory Visit: Payer: Self-pay

## 2020-07-13 DIAGNOSIS — Z20822 Contact with and (suspected) exposure to covid-19: Secondary | ICD-10-CM | POA: Diagnosis not present

## 2020-07-14 LAB — NOVEL CORONAVIRUS, NAA: SARS-CoV-2, NAA: NOT DETECTED

## 2020-07-14 LAB — SARS-COV-2, NAA 2 DAY TAT

## 2020-07-24 DIAGNOSIS — Z3009 Encounter for other general counseling and advice on contraception: Secondary | ICD-10-CM | POA: Diagnosis not present

## 2020-07-24 DIAGNOSIS — Z3201 Encounter for pregnancy test, result positive: Secondary | ICD-10-CM | POA: Diagnosis not present

## 2020-07-30 DIAGNOSIS — Z3481 Encounter for supervision of other normal pregnancy, first trimester: Secondary | ICD-10-CM | POA: Diagnosis not present

## 2020-08-01 DIAGNOSIS — O3680X9 Pregnancy with inconclusive fetal viability, other fetus: Secondary | ICD-10-CM | POA: Diagnosis not present

## 2020-08-30 DIAGNOSIS — Z124 Encounter for screening for malignant neoplasm of cervix: Secondary | ICD-10-CM | POA: Diagnosis not present

## 2020-08-30 DIAGNOSIS — Z369 Encounter for antenatal screening, unspecified: Secondary | ICD-10-CM | POA: Diagnosis not present

## 2020-08-30 DIAGNOSIS — Z113 Encounter for screening for infections with a predominantly sexual mode of transmission: Secondary | ICD-10-CM | POA: Diagnosis not present

## 2020-09-05 ENCOUNTER — Other Ambulatory Visit: Payer: Self-pay | Admitting: Obstetrics & Gynecology

## 2020-09-17 DIAGNOSIS — O99210 Obesity complicating pregnancy, unspecified trimester: Secondary | ICD-10-CM | POA: Insufficient documentation

## 2020-09-17 DIAGNOSIS — O99211 Obesity complicating pregnancy, first trimester: Secondary | ICD-10-CM | POA: Diagnosis not present

## 2020-09-17 DIAGNOSIS — O3680X0 Pregnancy with inconclusive fetal viability, not applicable or unspecified: Secondary | ICD-10-CM | POA: Diagnosis not present

## 2020-09-17 DIAGNOSIS — Z3A13 13 weeks gestation of pregnancy: Secondary | ICD-10-CM | POA: Diagnosis not present

## 2020-09-17 DIAGNOSIS — O34219 Maternal care for unspecified type scar from previous cesarean delivery: Secondary | ICD-10-CM | POA: Insufficient documentation

## 2020-09-17 DIAGNOSIS — O30039 Twin pregnancy, monochorionic/diamniotic, unspecified trimester: Secondary | ICD-10-CM | POA: Insufficient documentation

## 2020-09-17 DIAGNOSIS — E669 Obesity, unspecified: Secondary | ICD-10-CM | POA: Diagnosis not present

## 2020-09-17 DIAGNOSIS — O30031 Twin pregnancy, monochorionic/diamniotic, first trimester: Secondary | ICD-10-CM | POA: Diagnosis not present

## 2020-10-05 DIAGNOSIS — R8290 Unspecified abnormal findings in urine: Secondary | ICD-10-CM | POA: Diagnosis not present

## 2020-10-18 DIAGNOSIS — Z3482 Encounter for supervision of other normal pregnancy, second trimester: Secondary | ICD-10-CM | POA: Diagnosis not present

## 2020-10-26 DIAGNOSIS — Z3689 Encounter for other specified antenatal screening: Secondary | ICD-10-CM | POA: Diagnosis not present

## 2020-10-26 DIAGNOSIS — Z3A20 20 weeks gestation of pregnancy: Secondary | ICD-10-CM | POA: Diagnosis not present

## 2020-10-26 DIAGNOSIS — O30032 Twin pregnancy, monochorionic/diamniotic, second trimester: Secondary | ICD-10-CM | POA: Diagnosis not present

## 2020-11-30 DIAGNOSIS — O30032 Twin pregnancy, monochorionic/diamniotic, second trimester: Secondary | ICD-10-CM | POA: Diagnosis not present

## 2020-11-30 DIAGNOSIS — Z3A23 23 weeks gestation of pregnancy: Secondary | ICD-10-CM | POA: Diagnosis not present

## 2020-12-12 DIAGNOSIS — Z3480 Encounter for supervision of other normal pregnancy, unspecified trimester: Secondary | ICD-10-CM | POA: Diagnosis not present

## 2020-12-20 DIAGNOSIS — O99810 Abnormal glucose complicating pregnancy: Secondary | ICD-10-CM | POA: Diagnosis not present

## 2021-01-17 ENCOUNTER — Encounter (HOSPITAL_COMMUNITY): Payer: Self-pay

## 2021-01-17 ENCOUNTER — Inpatient Hospital Stay (HOSPITAL_COMMUNITY)
Admission: AD | Admit: 2021-01-17 | Discharge: 2021-01-17 | Disposition: A | Discharge status: Home / Self Care | Payer: Medicaid Other | Attending: Obstetrics | Admitting: Obstetrics

## 2021-01-17 DIAGNOSIS — O30043 Twin pregnancy, dichorionic/diamniotic, third trimester: Secondary | ICD-10-CM | POA: Diagnosis not present

## 2021-01-17 DIAGNOSIS — Z885 Allergy status to narcotic agent status: Secondary | ICD-10-CM | POA: Insufficient documentation

## 2021-01-17 DIAGNOSIS — R110 Nausea: Secondary | ICD-10-CM | POA: Diagnosis not present

## 2021-01-17 DIAGNOSIS — R109 Unspecified abdominal pain: Secondary | ICD-10-CM | POA: Diagnosis not present

## 2021-01-17 DIAGNOSIS — R030 Elevated blood-pressure reading, without diagnosis of hypertension: Secondary | ICD-10-CM | POA: Diagnosis not present

## 2021-01-17 DIAGNOSIS — Z9049 Acquired absence of other specified parts of digestive tract: Secondary | ICD-10-CM | POA: Insufficient documentation

## 2021-01-17 DIAGNOSIS — Z79899 Other long term (current) drug therapy: Secondary | ICD-10-CM | POA: Diagnosis not present

## 2021-01-17 DIAGNOSIS — Z8249 Family history of ischemic heart disease and other diseases of the circulatory system: Secondary | ICD-10-CM | POA: Insufficient documentation

## 2021-01-17 DIAGNOSIS — O26893 Other specified pregnancy related conditions, third trimester: Secondary | ICD-10-CM | POA: Insufficient documentation

## 2021-01-17 DIAGNOSIS — O99891 Other specified diseases and conditions complicating pregnancy: Secondary | ICD-10-CM | POA: Diagnosis not present

## 2021-01-17 DIAGNOSIS — Z3A33 33 weeks gestation of pregnancy: Secondary | ICD-10-CM

## 2021-01-17 DIAGNOSIS — R519 Headache, unspecified: Secondary | ICD-10-CM | POA: Insufficient documentation

## 2021-01-17 DIAGNOSIS — Z3A30 30 weeks gestation of pregnancy: Secondary | ICD-10-CM | POA: Diagnosis not present

## 2021-01-17 DIAGNOSIS — F17210 Nicotine dependence, cigarettes, uncomplicated: Secondary | ICD-10-CM | POA: Diagnosis not present

## 2021-01-17 DIAGNOSIS — O10913 Unspecified pre-existing hypertension complicating pregnancy, third trimester: Secondary | ICD-10-CM | POA: Insufficient documentation

## 2021-01-17 DIAGNOSIS — O99333 Smoking (tobacco) complicating pregnancy, third trimester: Secondary | ICD-10-CM | POA: Insufficient documentation

## 2021-01-17 DIAGNOSIS — O30033 Twin pregnancy, monochorionic/diamniotic, third trimester: Secondary | ICD-10-CM | POA: Insufficient documentation

## 2021-01-17 DIAGNOSIS — Z7982 Long term (current) use of aspirin: Secondary | ICD-10-CM | POA: Insufficient documentation

## 2021-01-17 LAB — PROTEIN / CREATININE RATIO, URINE
Creatinine, Urine: 175.77 mg/dL
Protein Creatinine Ratio: 0.22 mg/mg{Cre} — ABNORMAL HIGH (ref 0.00–0.15)
Total Protein, Urine: 39 mg/dL

## 2021-01-17 LAB — URINALYSIS, ROUTINE W REFLEX MICROSCOPIC
Bacteria, UA: NONE SEEN
Bilirubin Urine: NEGATIVE
Glucose, UA: NEGATIVE mg/dL
Ketones, ur: NEGATIVE mg/dL
Leukocytes,Ua: NEGATIVE
Nitrite: NEGATIVE
Protein, ur: 30 mg/dL — AB
Specific Gravity, Urine: 1.024 (ref 1.005–1.030)
pH: 6 (ref 5.0–8.0)

## 2021-01-17 LAB — COMPREHENSIVE METABOLIC PANEL
ALT: 24 U/L (ref 0–44)
AST: 24 U/L (ref 15–41)
Albumin: 2.8 g/dL — ABNORMAL LOW (ref 3.5–5.0)
Alkaline Phosphatase: 118 U/L (ref 38–126)
Anion gap: 6 (ref 5–15)
BUN: 6 mg/dL (ref 6–20)
CO2: 23 mmol/L (ref 22–32)
Calcium: 9 mg/dL (ref 8.9–10.3)
Chloride: 108 mmol/L (ref 98–111)
Creatinine, Ser: 0.56 mg/dL (ref 0.44–1.00)
GFR, Estimated: >60 mL/min (ref >60)
Glucose, Bld: 87 mg/dL (ref 70–99)
Potassium: 4.3 mmol/L (ref 3.5–5.1)
Sodium: 137 mmol/L (ref 135–145)
Total Bilirubin: 0.7 mg/dL (ref 0.3–1.2)
Total Protein: 6.3 g/dL — ABNORMAL LOW (ref 6.5–8.1)

## 2021-01-17 LAB — CBC
HCT: 31.1 % — ABNORMAL LOW (ref 36.0–46.0)
Hemoglobin: 10.2 g/dL — ABNORMAL LOW (ref 12.0–15.0)
MCH: 29 pg (ref 26.0–34.0)
MCHC: 32.8 g/dL (ref 30.0–36.0)
MCV: 88.4 fL (ref 80.0–100.0)
Platelets: 316 10*3/uL (ref 150–400)
RBC: 3.52 MIL/uL — ABNORMAL LOW (ref 3.87–5.11)
RDW: 13.7 % (ref 11.5–15.5)
WBC: 14.2 10*3/uL — ABNORMAL HIGH (ref 4.0–10.5)
nRBC: 0 % (ref 0.0–0.2)

## 2021-01-17 MED ORDER — CYCLOBENZAPRINE HCL 5 MG PO TABS
10.0000 mg | ORAL_TABLET | Freq: Once | ORAL | Status: AC
  Administered 2021-01-17: 10 mg via ORAL
  Filled 2021-01-17: qty 2

## 2021-01-17 MED ORDER — METOCLOPRAMIDE HCL 10 MG PO TABS: 10.0000 mg | ORAL_TABLET | Freq: Three times a day (TID) | ORAL | 0 refills | Status: DC | PRN

## 2021-01-17 MED ORDER — ACETAMINOPHEN 500 MG PO TABS
1000.0000 mg | ORAL_TABLET | Freq: Once | ORAL | Status: AC
  Administered 2021-01-17: 1000 mg via ORAL
  Filled 2021-01-17: qty 2

## 2021-01-17 MED ORDER — LACTATED RINGERS IV BOLUS
1000.0000 mL | Freq: Once | INTRAVENOUS | Status: AC
  Administered 2021-01-17: 1000 mL via INTRAVENOUS

## 2021-01-17 MED ORDER — DEXAMETHASONE SODIUM PHOSPHATE 10 MG/ML IJ SOLN
10.0000 mg | Freq: Once | INTRAMUSCULAR | Status: AC
  Administered 2021-01-17: 10 mg via INTRAVENOUS
  Filled 2021-01-17: qty 1

## 2021-01-17 MED ORDER — METOCLOPRAMIDE HCL 5 MG/ML IJ SOLN
10.0000 mg | Freq: Once | INTRAMUSCULAR | Status: AC
  Administered 2021-01-17: 10 mg via INTRAVENOUS
  Filled 2021-01-17: qty 2

## 2021-01-17 MED ORDER — CYCLOBENZAPRINE HCL 5 MG PO TABS: 5.0000 mg | ORAL_TABLET | Freq: Three times a day (TID) | ORAL | 0 refills | Status: DC | PRN

## 2021-01-17 MED ORDER — DIPHENHYDRAMINE HCL 50 MG/ML IJ SOLN
25.0000 mg | Freq: Once | INTRAMUSCULAR | Status: AC
  Administered 2021-01-17: 25 mg via INTRAVENOUS
  Filled 2021-01-17: qty 1

## 2021-01-17 NOTE — MAU Provider Note (Signed)
History     CSN: [id number]  Arrival date and time: 01/17/21 1635   Event Date/Time   First Provider Initiated Contact with Patient 01/17/21 1719      Chief Complaint  Patient presents with  . Abdominal Pain  . Headache  . Hypertension   HPI [name] is a 35 y.o. G2P1001 at [redacted]w[redacted]d with di/di twins who presents from the office for hypertension evaluation. Was seen at [facility] ob today & had elevated BP. Denies any history of hypertension. Reports recurrent headaches. Frontal headache that she rates 7/10. Took 2 extra strength tylenol yesterday with some relief at the time. Headache worse with lights. Also reports right upper quadrant soreness and nausea since yesterday. No vomiting or fever. Denies contractions, vaginal bleeding, or leaking of fluid. Reports good fetal movement x 2.   OB History    Gravida  2   Para  1   Term  1   Preterm  0   AB  0   Living  1     SAB  0   IAB  0   Ectopic  0   Multiple  0   Live Births  1           Past Medical History:  Diagnosis Date  . H pylori ulcer   . H. pylori infection     Past Surgical History:  Procedure Laterality Date  . CESAREAN SECTION    . CHOLECYSTECTOMY      Family History  Problem Relation Age of Onset  . Hypertension Mother   . Diabetes Maternal Grandmother   . Hypertension Maternal Grandmother     Social History   Tobacco Use  . Smoking status: Current Every Day Smoker    Packs/day: 0.50    Years: 10.00    Pack years: 5.00    Types: Cigarettes  . Smokeless tobacco: Never Used  Substance Use Topics  . Alcohol use: Yes    Comment: occasional  . Drug use: No    Allergies:  Allergies  Allergen Reactions  . Morphine And Related Shortness Of Breath    Medications Prior to Admission  Medication Sig Dispense Refill Last Dose  . acetaminophen (TYLENOL) 500 MG tablet Take 500 mg by mouth every 6 (six) hours as needed.   01/17/2021 at Unknown time  . aspirin EC 81 MG tablet  Take 81 mg by mouth daily. Swallow whole.   01/17/2021 at Unknown time  . Prenatal Vit-Fe Fumarate-FA (M-NATAL PLUS) 27-1 MG TABS TAKE 1 TABLET BY MOUTH EVERY DAY 30 tablet 3 01/17/2021 at Unknown time  . butalbital-acetaminophen-caffeine (FIORICET) 50-325-40 MG tablet Take 1 tablet by mouth every 4 (four) hours as needed.     . cholecalciferol (VITAMIN D) 1000 UNITS tablet Take 1,000 Units by mouth daily.     Marland Kitchen docusate sodium (COLACE) 100 MG capsule Take 100 mg by mouth daily.     Marland Kitchen omeprazole (PRILOSEC) 40 MG capsule Take 1 capsule by mouth daily.     . ondansetron (ZOFRAN-ODT) 4 MG disintegrating tablet Take 8 mg by mouth 2 (two) times daily.     . promethazine (PHENERGAN) 25 MG tablet Take 25 mg by mouth every 4 (four) hours.       Review of Systems  Constitutional: Negative.   Eyes: Positive for photophobia. Negative for visual disturbance.  Gastrointestinal: Positive for abdominal pain and nausea. Negative for constipation, diarrhea and vomiting.  Genitourinary: Negative.   Neurological: Positive for headaches.   Physical  Exam   Blood pressure (!) 144/74, pulse 99, temperature 98.1 F (36.7 C), temperature source Oral, resp. rate 20, height 5\' 5"  (1.651 m), weight 115.7 kg, SpO2 98 %.   Patient Vitals for the past 24 hrs:  BP Temp Temp src Pulse Resp SpO2 Height Weight  01/17/21 1940 (!) 144/74 -- -- 99 -- 98 % -- --  01/17/21 1931 (!) 152/72 -- -- 92 -- -- -- --  01/17/21 1920 -- -- -- -- -- 96 % -- --  01/17/21 1916 (!) 112/52 -- -- 96 -- -- -- --  01/17/21 1901 119/64 -- -- (!) 104 -- 99 % -- --  01/17/21 1900 -- -- -- -- -- 99 % -- --  01/17/21 1831 117/69 -- -- 90 -- -- -- --  01/17/21 1816 (!) 119/58 -- -- 96 -- -- -- --  01/17/21 1801 112/72 -- -- 91 -- -- -- --  01/17/21 1746 123/77 -- -- 96 -- -- -- --  01/17/21 1731 129/77 -- -- 96 -- -- -- --  01/17/21 1716 134/79 -- -- 90 -- -- -- --  01/17/21 1705 127/83 -- -- (!) 105 -- 98 % -- --  01/17/21 1650 121/73 98.1 F  (36.7 C) Oral 93 20 100 % 5\' 5"  (1.651 m) 115.7 kg    Physical Exam Vitals and nursing note reviewed.  Constitutional:      General: She is not in acute distress.    Appearance: She is well-developed.  HENT:     Head: Normocephalic and atraumatic.  Pulmonary:     Effort: Pulmonary effort is normal. No respiratory distress.     Breath sounds: Normal breath sounds.  Abdominal:     Palpations: Abdomen is soft.     Tenderness: There is no abdominal tenderness.  Musculoskeletal:     Right lower leg: 2+ Pitting Edema present.     Left lower leg: 2+ Pitting Edema present.  Skin:    General: Skin is warm and dry.  Neurological:     Mental Status: She is alert.     Deep Tendon Reflexes:     Reflex Scores:      Patellar reflexes are 2+ on the right side and 2+ on the left side.    Comments: No clonus  Psychiatric:        Mood and Affect: Mood normal.        Behavior: Behavior normal.    Fetal Tracing: Baby A Baseline: 135 Variability: moderate Accelerations: 15x15 Decelerations: none  Baby B Baseline: 140 Variability: moderate Accelerations:15x15 Decelerations: none   MAU Course  Procedures Results for orders placed or performed during the hospital encounter of 01/17/21 (from the past 24 hour(s))  Urinalysis, Routine w reflex microscopic Urine, Clean Catch     Status: Abnormal   Collection Time: 01/17/21  5:12 PM  Result Value Ref Range   Color, Urine YELLOW YELLOW   APPearance HAZY (A) CLEAR   Specific Gravity, Urine 1.024 1.005 - 1.030   pH 6.0 5.0 - 8.0   Glucose, UA NEGATIVE NEGATIVE mg/dL   Hgb urine dipstick SMALL (A) NEGATIVE   Bilirubin Urine NEGATIVE NEGATIVE   Ketones, ur NEGATIVE NEGATIVE mg/dL   Protein, ur 30 (A) NEGATIVE mg/dL   Nitrite NEGATIVE NEGATIVE   Leukocytes,Ua NEGATIVE NEGATIVE   RBC / HPF 6-10 0 - 5 RBC/hpf   WBC, UA 0-5 0 - 5 WBC/hpf   Bacteria, UA NONE SEEN NONE SEEN   Squamous Epithelial / LPF 0-5  0 - 5   Mucus PRESENT   Protein /  creatinine ratio, urine     Status: Abnormal   Collection Time: 01/17/21  5:12 PM  Result Value Ref Range   Creatinine, Urine 175.77 mg/dL   Total Protein, Urine 39 mg/dL   Protein Creatinine Ratio 0.22 (H) 0.00 - 0.15 mg/mg[Cre]  CBC     Status: Abnormal   Collection Time: 01/17/21  5:18 PM  Result Value Ref Range   WBC 14.2 (H) 4.0 - 10.5 K/uL   RBC 3.52 (L) 3.87 - 5.11 MIL/uL   Hemoglobin 10.2 (L) 12.0 - 15.0 g/dL   HCT 17.6 (L) 16.0 - 73.7 %   MCV 88.4 80.0 - 100.0 fL   MCH 29.0 26.0 - 34.0 pg   MCHC 32.8 30.0 - 36.0 g/dL   RDW 10.6 26.9 - 48.5 %   Platelets 316 150 - 400 K/uL   nRBC 0.0 0.0 - 0.2 %  Comprehensive metabolic panel     Status: Abnormal   Collection Time: 01/17/21  5:18 PM  Result Value Ref Range   Sodium 137 135 - 145 mmol/L   Potassium 4.3 3.5 - 5.1 mmol/L   Chloride 108 98 - 111 mmol/L   CO2 23 22 - 32 mmol/L   Glucose, Bld 87 70 - 99 mg/dL   BUN 6 6 - 20 mg/dL   Creatinine, Ser 4.62 0.44 - 1.00 mg/dL   Calcium 9.0 8.9 - 70.3 mg/dL   Total Protein 6.3 (L) 6.5 - 8.1 g/dL   Albumin 2.8 (L) 3.5 - 5.0 g/dL   AST 24 15 - 41 U/L   ALT 24 0 - 44 U/L   Alkaline Phosphatase 118 38 - 126 U/L   Total Bilirubin 0.7 0.3 - 1.2 mg/dL   GFR, Estimated >50 >09 mL/min   Anion gap 6 5 - 15    MDM Patient presents to MAU with headache & RUQ pain after an elevated BP in the office. Headache resolved with IV headache cocktail (decadron, reglan, benadryl). Elevated BP x 2, not severe range,  otherwise normotensive. Preeclampsia labs normal. She has an appointment in the office on Tuesday for a BP check.    Assessment and Plan   1. Elevated BP without diagnosis of hypertension  -discussed GHTN vs Preeclampsia  -reviewed s/s of preeclampsia & reasons to return to MAU -keep schedule BP check at [facility] ob on Tuesday  2. Pregnancy headache in third trimester  -rx reglan & flexeril -given instructions re management of headache at home & reasons to come to MAU for  evaluation/treatment  3. Monochorionic diamniotic twin gestation in third trimester   4. [redacted] weeks gestation of pregnancy      Judeth Horn 01/17/2021, 7:46 PM

## 2021-01-17 NOTE — Discharge Instructions (Signed)
For prevention of migraines in pregnancy: -Magnesium, 400mg  by mouth, once daily -Vitamin B2, 400mg  by mouth, once daily  For treatment of migraines in pregnancy: -take medication at the first sign of the pain of a headache, or the first sign of your aura -start with 1000mg  Tylenol (do not exceed 4000mg  of Tylenol in 24hrs), with or without Reglan 10mg  -if no relief after 1-2hours, can take Flexeril 10mg  -if still no relief, call office or present to MAU   Hypertension During Pregnancy High blood pressure (hypertension) is when the force of blood pumping through the arteries is high enough to cause problems with your health. Arteries are blood vessels that carry blood from the heart throughout the body. Hypertension during pregnancy can cause problems for you and your baby. It can be mild or severe. There are different types of hypertension that can happen during pregnancy. These include:  Chronic hypertension. This happens when you had high blood pressure before you became pregnant, and it continues during the pregnancy. Hypertension that develops before you are [redacted] weeks pregnant and continues during the pregnancy is also called chronic hypertension. If you have chronic hypertension, it will not go away after you have your baby. You will need follow-up visits with your health care provider after you have your baby. Your health care provider may want you to keep taking medicine for your blood pressure.  Gestational hypertension. This is hypertension that develops after the 20th week of pregnancy. Gestational hypertension usually goes away after you have your baby, but your health care provider will need to monitor your blood pressure to make sure that it is getting better.  Postpartum hypertension. This is high blood pressure that was present before delivery and continues after delivery or that starts after delivery. This usually occurs within 48 hours after childbirth but may occur up to 6 weeks  after giving birth. When hypertension during pregnancy is severe, it is a medical emergency that requires treatment right away. How does this affect me? Women who have hypertension during pregnancy have a greater chance of developing hypertension later in life or during future pregnancies. In some cases, hypertension during pregnancy can cause serious complications, such as:  Stroke.  Heart attack.  Injury to other organs, such as kidneys, lungs, or liver.  Preeclampsia.  A condition called hemolysis, elevated liver enzymes, and low platelet count (HELLP) syndrome.  Convulsions or seizures.  Placental abruption. How does this affect my baby? Hypertension during pregnancy can affect your baby. Your baby may:  Be born early (prematurely).  Not weigh as much as he or she should at birth (low birth weight).  Not tolerate labor well, leading to an unplanned cesarean delivery. This condition may also result in a baby's death before birth (stillbirth). What are the risks? There are certain factors that make it more likely for you to develop hypertension during pregnancy. These include:  Having hypertension during a previous pregnancy or a family history of hypertension.  Being overweight.  Being age 110 or older.  Being pregnant for the first time.  Being pregnant with more than one baby.  Becoming pregnant using fertilization methods, such as IVF (in vitro fertilization).  Having other medical problems, such as diabetes, kidney disease, or lupus. What can I do to lower my risk? The exact cause of hypertension during pregnancy is not known. You may be able to lower your risk by:  Maintaining a healthy weight.  Eating a healthy and balanced diet.  Following your health care  provider's instructions about treating any long-term conditions that you had before becoming pregnant. It is very important to keep all of your prenatal care appointments. Your health care provider will  check your blood pressure and make sure that your pregnancy is progressing as expected. If a problem is found, early treatment can prevent complications.   How is this treated? Treatment for hypertension during pregnancy varies depending on the type of hypertension you have and how serious it is.  If you were taking medicine for high blood pressure before you became pregnant, talk with your health care provider. You may need to change medicine during pregnancy because some medicines, like ACE inhibitors, may not be considered safe for your baby.  If you have gestational hypertension, your health care provider may order medicine to treat this during pregnancy.  If you are at risk for preeclampsia, your health care provider may recommend that you take a low-dose aspirin during your pregnancy.  If you have severe hypertension, you may need to be hospitalized so you and your baby can be monitored closely. You may also need to be given medicine to lower your blood pressure.  In some cases, if your condition gets worse, you may need to deliver your baby early. Follow these instructions at home: Eating and drinking  Drink enough fluid to keep your urine pale yellow.  Avoid caffeine.   Lifestyle  Do not use any products that contain nicotine or tobacco. These products include cigarettes, chewing tobacco, and vaping devices, such as e-cigarettes. If you need help quitting, ask your health care provider.  Do not use alcohol or drugs.  Avoid stress as much as possible.  Rest and get plenty of sleep.  Regular exercise can help to reduce your blood pressure. Ask your health care provider what kinds of exercise are best for you. General instructions  Take over-the-counter and prescription medicines only as told by your health care provider.  Keep all prenatal and follow-up visits. This is important. Contact a health care provider if:  You have symptoms that your health care provider told you  may require more treatment or monitoring, such as: ? Headaches. ? Nausea or vomiting. ? Abdominal pain. ? Dizziness. ? Light-headedness. Get help right away if:  You have symptoms of serious complications, such as: ? Severe abdominal pain that does not get better with treatment. ? A severe headache that does not get better, blurred vision, or double vision. ? Vomiting that does not get better. ? Sudden, rapid weight gain or swelling in your hands, ankles, or face. ? Vaginal bleeding. ? Blood in your urine. ? Shortness of breath or chest pain. ? Weakness on one side of your body or difficulty speaking.  Your baby is not moving as much as usual. These symptoms may represent a serious problem that is an emergency. Do not wait to see if the symptoms will go away. Get medical help right away. Call your local emergency services (911 in the [place] Do not drive yourself to the hospital. Summary  Hypertension during pregnancy can cause problems for you and your baby.  Treatment for hypertension during pregnancy varies depending on the type of hypertension you have and how serious it is.  Keep all prenatal and follow-up visits. This is important.  Get help right away if you have symptoms of serious complications related to high blood pressure. This information is not intended to replace advice given to you by your health care provider. Make sure you discuss any questions  you have with your health care provider. Document Revised: 05/03/2020 Document Reviewed: 05/03/2020 Elsevier Patient Education  2021 [employer].

## 2021-01-17 NOTE — MAU Note (Signed)
Started getting sharp pain in RUQ, started yesterday, off and on.  HA started after the abd pain, took Tylenol, eased it off.  Having mild cramps.  BP was up in office, +protein in urine.

## 2021-01-18 ENCOUNTER — Telehealth: Payer: Self-pay | Admitting: Certified Nurse Midwife

## 2021-01-18 NOTE — Telephone Encounter (Signed)
Patient states prescriptions aren't at pharmacy. Verified correct pharmacy and prescriptions from yesterday did not transmit over. Verbally called Rx for Reglan and Flexeril.

## 2021-01-22 DIAGNOSIS — O99213 Obesity complicating pregnancy, third trimester: Secondary | ICD-10-CM | POA: Diagnosis not present

## 2021-01-22 DIAGNOSIS — Z3A31 31 weeks gestation of pregnancy: Secondary | ICD-10-CM | POA: Diagnosis not present

## 2021-01-22 DIAGNOSIS — O30033 Twin pregnancy, monochorionic/diamniotic, third trimester: Secondary | ICD-10-CM | POA: Diagnosis not present

## 2021-01-23 ENCOUNTER — Encounter (HOSPITAL_COMMUNITY): Payer: Self-pay | Admitting: Obstetrics and Gynecology

## 2021-01-23 ENCOUNTER — Inpatient Hospital Stay (HOSPITAL_COMMUNITY)
Admission: AD | Admit: 2021-01-23 | Discharge: 2021-01-23 | Disposition: A | Discharge status: Home / Self Care | Payer: Medicaid Other | Attending: Obstetrics and Gynecology | Admitting: Obstetrics and Gynecology

## 2021-01-23 DIAGNOSIS — Z87891 Personal history of nicotine dependence: Secondary | ICD-10-CM | POA: Insufficient documentation

## 2021-01-23 DIAGNOSIS — O99353 Diseases of the nervous system complicating pregnancy, third trimester: Secondary | ICD-10-CM | POA: Diagnosis not present

## 2021-01-23 DIAGNOSIS — D649 Anemia, unspecified: Secondary | ICD-10-CM

## 2021-01-23 DIAGNOSIS — O99013 Anemia complicating pregnancy, third trimester: Secondary | ICD-10-CM

## 2021-01-23 DIAGNOSIS — Z7982 Long term (current) use of aspirin: Secondary | ICD-10-CM | POA: Diagnosis not present

## 2021-01-23 DIAGNOSIS — R519 Headache, unspecified: Secondary | ICD-10-CM | POA: Diagnosis not present

## 2021-01-23 DIAGNOSIS — G44319 Acute post-traumatic headache, not intractable: Secondary | ICD-10-CM

## 2021-01-23 DIAGNOSIS — Z79899 Other long term (current) drug therapy: Secondary | ICD-10-CM | POA: Diagnosis not present

## 2021-01-23 DIAGNOSIS — Z3A31 31 weeks gestation of pregnancy: Secondary | ICD-10-CM | POA: Diagnosis not present

## 2021-01-23 LAB — URINALYSIS, ROUTINE W REFLEX MICROSCOPIC
Bilirubin Urine: NEGATIVE
Glucose, UA: NEGATIVE mg/dL
Ketones, ur: NEGATIVE mg/dL
Leukocytes,Ua: NEGATIVE
Nitrite: NEGATIVE
Protein, ur: NEGATIVE mg/dL
Specific Gravity, Urine: 1.006 (ref 1.005–1.030)
pH: 6 (ref 5.0–8.0)

## 2021-01-23 LAB — TYPE AND SCREEN
ABO/RH(D): A POS
Antibody Screen: NEGATIVE

## 2021-01-23 LAB — COMPREHENSIVE METABOLIC PANEL
ALT: 21 U/L (ref 0–44)
AST: 17 U/L (ref 15–41)
Albumin: 2.9 g/dL — ABNORMAL LOW (ref 3.5–5.0)
Alkaline Phosphatase: 106 U/L (ref 38–126)
Anion gap: 13 (ref 5–15)
BUN: 7 mg/dL (ref 6–20)
CO2: 18 mmol/L — ABNORMAL LOW (ref 22–32)
Calcium: 9.1 mg/dL (ref 8.9–10.3)
Chloride: 102 mmol/L (ref 98–111)
Creatinine, Ser: 0.65 mg/dL (ref 0.44–1.00)
GFR, Estimated: >60 mL/min (ref >60)
Glucose, Bld: 91 mg/dL (ref 70–99)
Potassium: 3.7 mmol/L (ref 3.5–5.1)
Sodium: 133 mmol/L — ABNORMAL LOW (ref 135–145)
Total Bilirubin: 0.4 mg/dL (ref 0.3–1.2)
Total Protein: 6.3 g/dL — ABNORMAL LOW (ref 6.5–8.1)

## 2021-01-23 LAB — PROTEIN / CREATININE RATIO, URINE
Creatinine, Urine: 59.43 mg/dL
Total Protein, Urine: <6 mg/dL

## 2021-01-23 LAB — CBC
HCT: 30.9 % — ABNORMAL LOW (ref 36.0–46.0)
Hemoglobin: 9.9 g/dL — ABNORMAL LOW (ref 12.0–15.0)
MCH: 28.6 pg (ref 26.0–34.0)
MCHC: 32 g/dL (ref 30.0–36.0)
MCV: 89.3 fL (ref 80.0–100.0)
Platelets: 305 10*3/uL (ref 150–400)
RBC: 3.46 MIL/uL — ABNORMAL LOW (ref 3.87–5.11)
RDW: 14 % (ref 11.5–15.5)
WBC: 14.5 10*3/uL — ABNORMAL HIGH (ref 4.0–10.5)
nRBC: 0 % (ref 0.0–0.2)

## 2021-01-23 MED ORDER — MAGNESIUM OXIDE -MG SUPPLEMENT 200 MG PO TABS: 1.0000 | ORAL_TABLET | Freq: Every day | ORAL | 1 refills | Status: DC

## 2021-01-23 MED ORDER — METOCLOPRAMIDE HCL 10 MG PO TABS: 10.0000 mg | ORAL_TABLET | Freq: Three times a day (TID) | ORAL | 1 refills | Status: DC | PRN

## 2021-01-23 MED ORDER — LACTATED RINGERS IV BOLUS
1000.0000 mL | Freq: Once | INTRAVENOUS | Status: AC
  Administered 2021-01-23: 1000 mL via INTRAVENOUS

## 2021-01-23 MED ORDER — METOCLOPRAMIDE HCL 5 MG/ML IJ SOLN
10.0000 mg | Freq: Once | INTRAMUSCULAR | Status: AC
  Administered 2021-01-23: 10 mg via INTRAVENOUS
  Filled 2021-01-23: qty 2

## 2021-01-23 MED ORDER — FERROUS SULFATE 325 (65 FE) MG PO TABS: 325.0000 mg | ORAL_TABLET | ORAL | 1 refills | Status: DC

## 2021-01-23 MED ORDER — DIPHENHYDRAMINE HCL 50 MG/ML IJ SOLN
12.5000 mg | Freq: Once | INTRAMUSCULAR | Status: AC
  Administered 2021-01-23: 12.5 mg via INTRAVENOUS
  Filled 2021-01-23: qty 1

## 2021-01-23 MED ORDER — DEXAMETHASONE SODIUM PHOSPHATE 10 MG/ML IJ SOLN
10.0000 mg | Freq: Once | INTRAMUSCULAR | Status: AC
  Administered 2021-01-23: 10 mg via INTRAVENOUS
  Filled 2021-01-23: qty 1

## 2021-01-23 MED ORDER — DIPHENHYDRAMINE HCL 25 MG PO CAPS: 25.0000 mg | ORAL_CAPSULE | Freq: Four times a day (QID) | ORAL | 0 refills | Status: DC | PRN

## 2021-01-23 NOTE — Discharge Instructions (Signed)
-  Take magnesium every night  -Take 25 mg of Benadryl and 10 mg of Reglan and 325 of Tylenol when HA comes on -can try Fioricet and Flexeril if this doesn't work -Take iron pills every other day

## 2021-01-23 NOTE — MAU Note (Signed)
Had a HA since 1000 this morning, took her meds and Tylenol as prescribed. BP has been elevated when checked at home, [phone]. Today has just felt very uncomfortable. No visual changes.  No constant RUQ pain ("here and there"), no increase in swelling.

## 2021-01-23 NOTE — MAU Provider Note (Signed)
Patient [name] is a 35 y.o. G2P1001  at [redacted]w[redacted]d here with complaints of headache at 10 am. She reports that her blood pressure was 164/78 and 134/72 at home today. . She called [facility] this morning to tell them that she had a headache. Per patient, OB RN called patient back to check on HA and blood pressure, and to recommend that patient come to MAU for evaluation.   She denies vaginal bleeding, LOF, decreased fetal movements. She reports that she has a history of headaches in pregnancy, and that she has Fioricet at home. She "doesn't remember" if the Fioricet helped with these past headaches.   She denies history of HTN, diabetes. She had a c/section in 2008.   Patient was seen in MAU on 5/25 for similar HA. She was given HA cocktail which helped relieve her symptoms.  She was sent home with flexeril and reglan. These medicines helped "a little bit" but today her HA is not responsive to these treatments.   She is here because she is worried about her HA and her blood pressures (at home).   She had a BP check at [facility] yesterday and, while patient cannot clearly recall, she does not think her BP was elevated.  History     CSN: [id number]  Arrival date and time: 01/23/21 1733   Event Date/Time   First Provider Initiated Contact with Patient 01/23/21 1900      Chief Complaint  Patient presents with  . Headache   Headache  This is a chronic problem. The current episode started in the past 7 days. The problem occurs constantly. The pain is located in the frontal region. Radiates to: radiates to the back of her head. The quality of the pain is described as throbbing. The pain is at a severity of 8/10. Associated symptoms include phonophobia and photophobia. Pertinent negatives include no abdominal pain, blurred vision, visual change, vomiting or weakness. She has tried acetaminophen (she tried reglan and ) for the symptoms.   She reports that her HA was an 8/10 last week,  today it just "felt like it was throbbing more".    OB History    Gravida  2   Para  1   Term  1   Preterm  0   AB  0   Living  1     SAB  0   IAB  0   Ectopic  0   Multiple  0   Live Births  1           Past Medical History:  Diagnosis Date  . H pylori ulcer   . H. pylori infection     Past Surgical History:  Procedure Laterality Date  . CESAREAN SECTION    . CHOLECYSTECTOMY      Family History  Problem Relation Age of Onset  . Hypertension Mother   . Diabetes Maternal Grandmother   . Hypertension Maternal Grandmother     Social History   Tobacco Use  . Smoking status: Former Smoker    Packs/day: 0.50    Years: 10.00    Pack years: 5.00    Types: Cigarettes  . Smokeless tobacco: Never Used  Substance Use Topics  . Alcohol use: Not Currently    Comment: occasional  . Drug use: No    Allergies:  Allergies  Allergen Reactions  . Morphine And Related Shortness Of Breath    Medications Prior to Admission  Medication Sig Dispense Refill Last Dose  .  acetaminophen (TYLENOL) 500 MG tablet Take 500 mg by mouth every 6 (six) hours as needed.     Marland Kitchen aspirin EC 81 MG tablet Take 81 mg by mouth daily. Swallow whole.     . butalbital-acetaminophen-caffeine (FIORICET) 50-325-40 MG tablet Take 1 tablet by mouth every 4 (four) hours as needed.     . cholecalciferol (VITAMIN D) 1000 UNITS tablet Take 1,000 Units by mouth daily.     . cyclobenzaprine (FLEXERIL) 5 MG tablet Take 1 tablet (5 mg total) by mouth 3 (three) times daily as needed (headache). 20 tablet 0   . docusate sodium (COLACE) 100 MG capsule Take 100 mg by mouth daily.     Marland Kitchen omeprazole (PRILOSEC) 40 MG capsule Take 1 capsule by mouth daily.     . ondansetron (ZOFRAN-ODT) 4 MG disintegrating tablet Take 8 mg by mouth 2 (two) times daily.     . Prenatal Vit-Fe Fumarate-FA (M-NATAL PLUS) 27-1 MG TABS TAKE 1 TABLET BY MOUTH EVERY DAY 30 tablet 3   . promethazine (PHENERGAN) 25 MG tablet Take  25 mg by mouth every 4 (four) hours.     . [DISCONTINUED] metoCLOPramide (REGLAN) 10 MG tablet Take 1 tablet (10 mg total) by mouth every 8 (eight) hours as needed (headache). 30 tablet 0     Review of Systems  Constitutional: Negative.   HENT: Negative.   Eyes: Positive for photophobia. Negative for blurred vision.  Respiratory: Negative for shortness of breath.   Cardiovascular: Negative for chest pain.  Gastrointestinal: Negative.  Negative for abdominal pain and vomiting.  Genitourinary: Negative.   Neurological: Positive for headaches. Negative for weakness.   Physical Exam   Blood pressure 111/61, pulse (!) 102, temperature 98.6 F (37 C), temperature source Oral, resp. rate 20, height 5\' 5"  (1.651 m), weight 117.3 kg, SpO2 94 %.  Physical Exam Constitutional:      Appearance: She is well-developed.  Pulmonary:     Effort: Pulmonary effort is normal.  Neurological:     Mental Status: She is alert and oriented to person, place, and time.     Cranial Nerves: No cranial nerve deficit or facial asymmetry.     Sensory: No sensory deficit.     Motor: No weakness.     Coordination: Coordination normal.  Psychiatric:        Mood and Affect: Mood normal.     MAU Course  Procedures  MDM -CBC, CMP and PCR are normal -Patient had no elevated BPs in MAU -Patient had HA cocktail which brought pain down to 0/10.  Patient Vitals for the past 24 hrs:  BP Temp Temp src Pulse Resp SpO2 Height Weight  01/23/21 2145 111/61 -- -- (!) 102 -- -- -- --  01/23/21 2130 135/70 -- -- 100 -- -- -- --  01/23/21 2115 135/65 -- -- 96 -- -- -- --  01/23/21 2100 132/74 -- -- 91 -- -- -- --  01/23/21 2045 132/72 -- -- 95 -- -- -- --  01/23/21 2031 134/71 -- -- 96 -- -- -- --  01/23/21 2016 140/76 -- -- 91 -- -- -- --  01/23/21 2009 131/69 -- -- 96 -- -- -- --  01/23/21 1954 127/71 -- -- 92 -- -- -- --  01/23/21 1930 137/77 -- -- 95 -- -- -- --  01/23/21 1915 139/75 -- -- 96 -- 94 % -- --   01/23/21 1900 134/74 -- -- 95 -- 96 % -- --  01/23/21 1845 126/61 -- -- 96 --  96 % -- --  01/23/21 1812 132/80 98.6 F (37 C) Oral (!) 106 20 99 % 5\' 5"  (1.651 m) 117.3 kg   -labs reviewed, normal platelets, LFTS, PCR, slight anemia at 9.9  -NST: 135 bpm, mod var, present acel, neg decels,  -NST: 135 bpm, mod var, present acel, neg decels Patient feels active movements while in MAU.  Assessment and Plan   1. Anemia, unspecified type   2. Acute nonintractable headache, unspecified headache type    -Patient stable for discharge; HA instructions given.  -Rx given for Magnesium, new RX for Reglan, Benadryl, recommend iron for anemia; anemia may be contributing to her HA -Keep appt next Thursday with [facility] -return to MAU if any concerns for BP, HA, blurry vision, floating spots, RUQ pain or other signs of pre-eclampsia.   [facility] Ziggy Reveles 01/23/2021, 9:57 PM

## 2021-01-24 LAB — RPR: RPR Ser Ql: NONREACTIVE

## 2021-02-06 ENCOUNTER — Encounter (HOSPITAL_COMMUNITY): Payer: Self-pay | Admitting: Obstetrics

## 2021-02-06 ENCOUNTER — Other Ambulatory Visit: Payer: Self-pay

## 2021-02-06 ENCOUNTER — Inpatient Hospital Stay (HOSPITAL_COMMUNITY)
Admission: AD | Admit: 2021-02-06 | Discharge: 2021-02-06 | Disposition: A | Discharge status: Home / Self Care | Payer: Medicaid Other | Attending: Obstetrics | Admitting: Obstetrics

## 2021-02-06 DIAGNOSIS — R109 Unspecified abdominal pain: Secondary | ICD-10-CM | POA: Diagnosis not present

## 2021-02-06 DIAGNOSIS — O321XX2 Maternal care for breech presentation, fetus 2: Secondary | ICD-10-CM | POA: Diagnosis not present

## 2021-02-06 DIAGNOSIS — Z7982 Long term (current) use of aspirin: Secondary | ICD-10-CM | POA: Insufficient documentation

## 2021-02-06 DIAGNOSIS — O34219 Maternal care for unspecified type scar from previous cesarean delivery: Secondary | ICD-10-CM | POA: Diagnosis not present

## 2021-02-06 DIAGNOSIS — O99213 Obesity complicating pregnancy, third trimester: Secondary | ICD-10-CM | POA: Diagnosis not present

## 2021-02-06 DIAGNOSIS — Z3689 Encounter for other specified antenatal screening: Secondary | ICD-10-CM | POA: Diagnosis not present

## 2021-02-06 DIAGNOSIS — Z3A33 33 weeks gestation of pregnancy: Secondary | ICD-10-CM | POA: Diagnosis not present

## 2021-02-06 DIAGNOSIS — Z79899 Other long term (current) drug therapy: Secondary | ICD-10-CM | POA: Insufficient documentation

## 2021-02-06 DIAGNOSIS — Z87891 Personal history of nicotine dependence: Secondary | ICD-10-CM | POA: Insufficient documentation

## 2021-02-06 DIAGNOSIS — O30033 Twin pregnancy, monochorionic/diamniotic, third trimester: Secondary | ICD-10-CM | POA: Insufficient documentation

## 2021-02-06 DIAGNOSIS — O26893 Other specified pregnancy related conditions, third trimester: Secondary | ICD-10-CM | POA: Diagnosis not present

## 2021-02-06 DIAGNOSIS — O99333 Smoking (tobacco) complicating pregnancy, third trimester: Secondary | ICD-10-CM | POA: Diagnosis not present

## 2021-02-06 DIAGNOSIS — E669 Obesity, unspecified: Secondary | ICD-10-CM | POA: Diagnosis not present

## 2021-02-06 LAB — URINALYSIS, ROUTINE W REFLEX MICROSCOPIC
Bilirubin Urine: NEGATIVE
Glucose, UA: NEGATIVE mg/dL
Ketones, ur: NEGATIVE mg/dL
Leukocytes,Ua: NEGATIVE
Nitrite: NEGATIVE
Protein, ur: NEGATIVE mg/dL
Specific Gravity, Urine: 1.015 (ref 1.005–1.030)
pH: 6 (ref 5.0–8.0)

## 2021-02-06 NOTE — MAU Note (Signed)
...  [name] is a 35 y.o. at [redacted]w[redacted]d with mo di twin girls here in MAU reporting: Sent from office for further monitoring of baby b. Baby b received a 6/8 for breathing. Patient states she has been experiencing constant abdominal pain that feels like burning for about one week now. She states she has an OB appointment tomorrow to address this issue. No VB or LOF. +FM x2.

## 2021-02-06 NOTE — MAU Provider Note (Signed)
History     [id number]  Arrival date and time: 02/06/21 0946    Chief Complaint  Patient presents with   fetal monitoring     HPI [name] is a 35 y.o. at [redacted]w[redacted]d with mo/di twins who presents for fetal monitoring. Was seen for BPP this morning by [facility] MFM & baby B was 6/8 (unknown what taken off for). She states she was sent here for an NST. Reports constant abdominal pain that is going to be evaluated in the office tomorrow by her ob. Denies contractions, LOF, or vaginal bleeding. Reports good fetal movement x 2.    Vaginal bleeding: No LOF: No Fetal Movement: Yes Contractions: No  Review of outside prenatal records from [facility] (in media tab):   Review of records from Care Everywhere: [facility] MFM appt today   --/--/A POS (06/01 1938)  OB History     Gravida  2   Para  1   Term  1   Preterm  0   AB  0   Living  1      SAB  0   IAB  0   Ectopic  0   Multiple  0   Live Births  1           Past Medical History:  Diagnosis Date   H pylori ulcer    H. pylori infection     Past Surgical History:  Procedure Laterality Date   CESAREAN SECTION     CHOLECYSTECTOMY      Family History  Problem Relation Age of Onset   Hypertension Mother    Diabetes Maternal Grandmother    Hypertension Maternal Grandmother     Social History   Socioeconomic History   Marital status: Single    Spouse name: Not on file   Number of children: Not on file   Years of education: Not on file   Highest education level: Not on file  Occupational History   Not on file  Tobacco Use   Smoking status: Former    Packs/day: 0.50    Years: 10.00    Pack years: 5.00    Types: Cigarettes   Smokeless tobacco: Never  Substance and Sexual Activity   Alcohol use: Not Currently    Comment: occasional   Drug use: No   Sexual activity: Yes    Birth control/protection: Implant  Other Topics Concern   Not on file  Social History  Narrative   Not on file   Social Determinants of Health   Financial Resource Strain: Not on file  Food Insecurity: Not on file  Transportation Needs: Not on file  Physical Activity: Not on file  Stress: Not on file  Social Connections: Not on file  Intimate Partner Violence: Not on file    Allergies  Allergen Reactions   Morphine And Related Shortness Of Breath    No current facility-administered medications on file prior to encounter.   Current Outpatient Medications on File Prior to Encounter  Medication Sig Dispense Refill   acetaminophen (TYLENOL) 500 MG tablet Take 500 mg by mouth every 6 (six) hours as needed.     aspirin EC 81 MG tablet Take 81 mg by mouth daily. Swallow whole.     butalbital-acetaminophen-caffeine (FIORICET) 50-325-40 MG tablet Take 1 tablet by mouth every 4 (four) hours as needed.     cyclobenzaprine (FLEXERIL) 5 MG tablet Take 1 tablet (5 mg total) by mouth 3 (three) times daily as  needed (headache). 20 tablet 0   diphenhydrAMINE (BENADRYL) 25 mg capsule Take 1 capsule (25 mg total) by mouth every 6 (six) hours as needed. 30 capsule 0   ferrous sulfate 325 (65 FE) MG tablet Take 1 tablet (325 mg total) by mouth every other day. 30 tablet 1   Magnesium Oxide 200 MG TABS Take 1 tablet (200 mg total) by mouth daily at 8 pm. 30 tablet 1   omeprazole (PRILOSEC) 40 MG capsule Take 1 capsule by mouth daily.     Prenatal Vit-Fe Fumarate-FA (M-NATAL PLUS) 27-1 MG TABS TAKE 1 TABLET BY MOUTH EVERY DAY 30 tablet 3   cholecalciferol (VITAMIN D) 1000 UNITS tablet Take 1,000 Units by mouth daily.     docusate sodium (COLACE) 100 MG capsule Take 100 mg by mouth daily.     metoCLOPramide (REGLAN) 10 MG tablet Take 1 tablet (10 mg total) by mouth every 8 (eight) hours as needed (headache). 30 tablet 1     Review of Systems  Constitutional: Negative.   Gastrointestinal:  Positive for abdominal pain.  Genitourinary: Negative.   Pertinent positives and negative per  HPI, all others reviewed and negative  Physical Exam   BP 127/80 (BP Location: Right Arm)   Pulse 92   Temp 98.2 F (36.8 C) (Oral)   Resp 19   SpO2 98%   Physical Exam Vitals and nursing note reviewed. Exam conducted with a chaperone present.  Constitutional:      General: She is not in acute distress.    Appearance: Normal appearance.  HENT:     Head: Normocephalic and atraumatic.  Eyes:     General: No scleral icterus. Pulmonary:     Effort: Pulmonary effort is normal. No respiratory distress.  Skin:    General: Skin is warm and dry.  Neurological:     Mental Status: She is alert.  Psychiatric:        Mood and Affect: Mood normal.        Behavior: Behavior normal.    Cervical Exam Dilation: Closed Effacement (%): Thick Cervical Position: Posterior Station: -3 Exam by:: Judeth Horn NP  Fetal Tracing: Baby A Baseline: 135 Variability: moderate Accelerations: 15x15 Decelerations: none  Baby B Baseline: 135 Variability: moderate Accelerations: 15x15 Decelerations: none  Toco: Q2-3 minutes    Labs Results for orders placed or performed during the hospital encounter of 02/06/21 (from the past 24 hour(s))  Urinalysis, Routine w reflex microscopic Urine, Clean Catch     Status: Abnormal   Collection Time: 02/06/21 10:24 AM  Result Value Ref Range   Color, Urine YELLOW YELLOW   APPearance HAZY (A) CLEAR   Specific Gravity, Urine 1.015 1.005 - 1.030   pH 6.0 5.0 - 8.0   Glucose, UA NEGATIVE NEGATIVE mg/dL   Hgb urine dipstick SMALL (A) NEGATIVE   Bilirubin Urine NEGATIVE NEGATIVE   Ketones, ur NEGATIVE NEGATIVE mg/dL   Protein, ur NEGATIVE NEGATIVE mg/dL   Nitrite NEGATIVE NEGATIVE   Leukocytes,Ua NEGATIVE NEGATIVE   RBC / HPF 0-5 0 - 5 RBC/hpf   WBC, UA 6-10 0 - 5 WBC/hpf   Bacteria, UA FEW (A) NONE SEEN   Squamous Epithelial / LPF 0-5 0 - 5   Mucus PRESENT     Imaging No results found.  MAU Course  Procedures Lab Orders  Urinalysis,  Routine w reflex microscopic Urine, Clean Catch  No orders of the defined types were placed in this encounter.  Imaging Orders  No imaging studies  ordered today    MDM Pt sent from office fetal monitoring. Per Dr. Chestine Spore & care everywhere, she had BPP at [facility] MFM this morning. Baby A was 8/8. Baby B was 6/8 (off for breathing) with normal AFI. Sent here for NST, with plan to return to MFM next week if reactive.  Fetal tracing in MAU is reactive x 2.   Ctx on monitor. Patient denies feeling contractions. Cervix closed/thick. Pt has appt with [employer] ob tomorrow.   Assessment and Plan   1. NST (non-stress test) reactive   2. Monochorionic diamniotic twin gestation in third trimester   3. [redacted] weeks gestation of pregnancy    -reviewed reasons to return to MAU -keep appt with ob tomorrow  Judeth Horn, NP

## 2021-02-11 ENCOUNTER — Other Ambulatory Visit: Payer: Self-pay

## 2021-02-11 ENCOUNTER — Inpatient Hospital Stay (HOSPITAL_COMMUNITY)
Admission: AD | Admit: 2021-02-11 | Discharge: 2021-02-11 | Disposition: A | Discharge status: Home / Self Care | Payer: Medicaid Other | Attending: Obstetrics and Gynecology | Admitting: Obstetrics and Gynecology

## 2021-02-11 DIAGNOSIS — Z3689 Encounter for other specified antenatal screening: Secondary | ICD-10-CM | POA: Diagnosis not present

## 2021-02-11 DIAGNOSIS — Z3A34 34 weeks gestation of pregnancy: Secondary | ICD-10-CM | POA: Insufficient documentation

## 2021-02-11 DIAGNOSIS — Z885 Allergy status to narcotic agent status: Secondary | ICD-10-CM | POA: Diagnosis not present

## 2021-02-11 DIAGNOSIS — Z87891 Personal history of nicotine dependence: Secondary | ICD-10-CM | POA: Diagnosis not present

## 2021-02-11 DIAGNOSIS — Z7982 Long term (current) use of aspirin: Secondary | ICD-10-CM | POA: Diagnosis not present

## 2021-02-11 DIAGNOSIS — Z79899 Other long term (current) drug therapy: Secondary | ICD-10-CM | POA: Diagnosis not present

## 2021-02-11 DIAGNOSIS — O30033 Twin pregnancy, monochorionic/diamniotic, third trimester: Secondary | ICD-10-CM | POA: Diagnosis not present

## 2021-02-11 DIAGNOSIS — O99213 Obesity complicating pregnancy, third trimester: Secondary | ICD-10-CM | POA: Diagnosis not present

## 2021-02-11 NOTE — MAU Provider Note (Signed)
History     CSN: [id number]  Arrival date and time: 02/11/21 1631   Event Date/Time   First Provider Initiated Contact with Patient 02/11/21 1807      Chief Complaint  Patient presents with   Fetal monitoring   HPI  Ms.[name] is a 35 y.o. female G2P1001 @ [redacted]w[redacted]d with mono/di twins here for monitoring. She was seen at [facility] MFM for weekly monitoring today and Baby A scored 8/8 & Baby B was 6/8. She went to the office for an NST and baby was not reactive in the office, baby was reassuring. She was sent here for further monitoring.   + fetal movement x two.   OB History     Gravida  2   Para  1   Term  1   Preterm  0   AB  0   Living  1      SAB  0   IAB  0   Ectopic  0   Multiple  0   Live Births  1           Past Medical History:  Diagnosis Date   H pylori ulcer    H. pylori infection     Past Surgical History:  Procedure Laterality Date   CESAREAN SECTION     CHOLECYSTECTOMY      Family History  Problem Relation Age of Onset   Hypertension Mother    Diabetes Maternal Grandmother    Hypertension Maternal Grandmother     Social History   Tobacco Use   Smoking status: Former    Packs/day: 0.50    Years: 10.00    Pack years: 5.00    Types: Cigarettes   Smokeless tobacco: Never  Substance Use Topics   Alcohol use: Not Currently    Comment: occasional   Drug use: No    Allergies:  Allergies  Allergen Reactions   Morphine And Related Shortness Of Breath    Medications Prior to Admission  Medication Sig Dispense Refill Last Dose   acetaminophen (TYLENOL) 500 MG tablet Take 500 mg by mouth every 6 (six) hours as needed.      aspirin EC 81 MG tablet Take 81 mg by mouth daily. Swallow whole.      butalbital-acetaminophen-caffeine (FIORICET) 50-325-40 MG tablet Take 1 tablet by mouth every 4 (four) hours as needed.      cyclobenzaprine (FLEXERIL) 5 MG tablet Take 1 tablet (5 mg total) by mouth 3 (three) times daily as  needed (headache). 20 tablet 0    diphenhydrAMINE (BENADRYL) 25 mg capsule Take 1 capsule (25 mg total) by mouth every 6 (six) hours as needed. 30 capsule 0    docusate sodium (COLACE) 100 MG capsule Take 100 mg by mouth daily.      ferrous sulfate 325 (65 FE) MG tablet Take 1 tablet (325 mg total) by mouth every other day. 30 tablet 1    Magnesium Oxide 200 MG TABS Take 1 tablet (200 mg total) by mouth daily at 8 pm. 30 tablet 1    metoCLOPramide (REGLAN) 10 MG tablet Take 1 tablet (10 mg total) by mouth every 8 (eight) hours as needed (headache). 30 tablet 1    omeprazole (PRILOSEC) 40 MG capsule Take 1 capsule by mouth daily.      Prenatal Vit-Fe Fumarate-FA (M-NATAL PLUS) 27-1 MG TABS TAKE 1 TABLET BY MOUTH EVERY DAY 30 tablet 3    No results found for this or any previous visit (  from the past 48 hour(s)).   Review of Systems  Gastrointestinal:  Negative for abdominal pain.  Genitourinary:  Negative for vaginal bleeding and vaginal discharge.  Physical Exam   Blood pressure 115/76, pulse 97, temperature 98.2 F (36.8 C), temperature source Oral, resp. rate 16, height 5\' 5"  (1.651 m), weight 117.4 kg, SpO2 98 %.  Physical Exam Constitutional:      General: She is not in acute distress.    Appearance: Normal appearance. She is not ill-appearing, toxic-appearing or diaphoretic.  Abdominal:     Palpations: Abdomen is soft.     Tenderness: There is no abdominal tenderness.  Neurological:     Mental Status: She is alert and oriented to person, place, and time.    Fetal Tracing Fetus A Baseline: 135 bpm Variability: Moderate  Accelerations: 15x15 Decelerations: None Toco: None  Fetal Tracing Fetus B Baseline: 130 bpm Variability: Moderate Accelerations: 15x15 Decelerations: None    MAU Course  Procedures None  MDM NST reactive x 2  Assessment and Plan   A:  1. Monochorionic diamniotic twin gestation in third trimester   2. [redacted] weeks gestation of pregnancy   3. NST  (non-stress test) reactive      P:  Discharge home in stable condition F/u in the office as scheduled Fetal kick counts reviewed  [name], , NP 02/13/2021 1:09 PM

## 2021-02-11 NOTE — MAU Note (Signed)
States was seen for BPP today and was told that 'baby B didn't pass, " so MD office sent her here to MAU for extended monitoring.

## 2021-02-11 NOTE — MAU Note (Signed)
[name] is a 35 y.o. at [redacted]w[redacted]d here in MAU reporting: was sent over from the office for prolonged monitoring. States Baby B didn't pass the BPP. +FM  Onset of complaint: today  Pain score: 0/10  Vitals:   02/11/21 1651  BP: 114/74  Pulse: 95  Resp: 18  Temp: 98 F (36.7 C)  SpO2: 98%     QIW:LNLG a 152, baby b 130  Lab orders placed from triage: none

## 2021-02-27 ENCOUNTER — Other Ambulatory Visit: Payer: Self-pay

## 2021-02-27 ENCOUNTER — Encounter (HOSPITAL_COMMUNITY)
Admission: AD | Disposition: A | Discharge status: Home / Self Care | Payer: Self-pay | Source: Home / Self Care | Attending: Obstetrics

## 2021-02-27 ENCOUNTER — Inpatient Hospital Stay (HOSPITAL_COMMUNITY): Payer: Medicaid Other | Admitting: Anesthesiology

## 2021-02-27 ENCOUNTER — Encounter (HOSPITAL_COMMUNITY): Payer: Self-pay | Admitting: Obstetrics

## 2021-02-27 ENCOUNTER — Inpatient Hospital Stay (HOSPITAL_COMMUNITY)
Admission: AD | Admit: 2021-02-27 | Discharge: 2021-03-02 | DRG: 784 | Disposition: A | Discharge status: Home / Self Care | Payer: Medicaid Other | Attending: Obstetrics | Admitting: Obstetrics

## 2021-02-27 DIAGNOSIS — O9081 Anemia of the puerperium: Secondary | ICD-10-CM | POA: Diagnosis not present

## 2021-02-27 DIAGNOSIS — O42913 Preterm premature rupture of membranes, unspecified as to length of time between rupture and onset of labor, third trimester: Secondary | ICD-10-CM | POA: Diagnosis present

## 2021-02-27 DIAGNOSIS — O328XX2 Maternal care for other malpresentation of fetus, fetus 2: Secondary | ICD-10-CM | POA: Diagnosis present

## 2021-02-27 DIAGNOSIS — O163 Unspecified maternal hypertension, third trimester: Secondary | ICD-10-CM

## 2021-02-27 DIAGNOSIS — O34211 Maternal care for low transverse scar from previous cesarean delivery: Secondary | ICD-10-CM | POA: Diagnosis not present

## 2021-02-27 DIAGNOSIS — Z3A36 36 weeks gestation of pregnancy: Secondary | ICD-10-CM

## 2021-02-27 DIAGNOSIS — Z87891 Personal history of nicotine dependence: Secondary | ICD-10-CM

## 2021-02-27 DIAGNOSIS — Z302 Encounter for sterilization: Secondary | ICD-10-CM | POA: Diagnosis not present

## 2021-02-27 DIAGNOSIS — O30033 Twin pregnancy, monochorionic/diamniotic, third trimester: Secondary | ICD-10-CM | POA: Diagnosis present

## 2021-02-27 DIAGNOSIS — O134 Gestational [pregnancy-induced] hypertension without significant proteinuria, complicating childbirth: Principal | ICD-10-CM | POA: Diagnosis present

## 2021-02-27 DIAGNOSIS — Z3689 Encounter for other specified antenatal screening: Secondary | ICD-10-CM

## 2021-02-27 DIAGNOSIS — D62 Acute posthemorrhagic anemia: Secondary | ICD-10-CM | POA: Diagnosis not present

## 2021-02-27 DIAGNOSIS — O99214 Obesity complicating childbirth: Secondary | ICD-10-CM | POA: Diagnosis not present

## 2021-02-27 DIAGNOSIS — O133 Gestational [pregnancy-induced] hypertension without significant proteinuria, third trimester: Secondary | ICD-10-CM | POA: Diagnosis not present

## 2021-02-27 DIAGNOSIS — Z20822 Contact with and (suspected) exposure to covid-19: Secondary | ICD-10-CM | POA: Diagnosis present

## 2021-02-27 LAB — COMPREHENSIVE METABOLIC PANEL
ALT: 19 U/L (ref 0–44)
AST: 22 U/L (ref 15–41)
Albumin: 2.7 g/dL — ABNORMAL LOW (ref 3.5–5.0)
Alkaline Phosphatase: 217 U/L — ABNORMAL HIGH (ref 38–126)
Anion gap: 9 (ref 5–15)
BUN: 8 mg/dL (ref 6–20)
CO2: 19 mmol/L — ABNORMAL LOW (ref 22–32)
Calcium: 8.8 mg/dL — ABNORMAL LOW (ref 8.9–10.3)
Chloride: 107 mmol/L (ref 98–111)
Creatinine, Ser: 0.53 mg/dL (ref 0.44–1.00)
GFR, Estimated: >60 mL/min (ref >60)
Glucose, Bld: 72 mg/dL (ref 70–99)
Potassium: 4.1 mmol/L (ref 3.5–5.1)
Sodium: 135 mmol/L (ref 135–145)
Total Bilirubin: 0.6 mg/dL (ref 0.3–1.2)
Total Protein: 6.3 g/dL — ABNORMAL LOW (ref 6.5–8.1)

## 2021-02-27 LAB — CBC
HCT: 33.2 % — ABNORMAL LOW (ref 36.0–46.0)
Hemoglobin: 11 g/dL — ABNORMAL LOW (ref 12.0–15.0)
MCH: 29.3 pg (ref 26.0–34.0)
MCHC: 33.1 g/dL (ref 30.0–36.0)
MCV: 88.3 fL (ref 80.0–100.0)
Platelets: 274 10*3/uL (ref 150–400)
RBC: 3.76 MIL/uL — ABNORMAL LOW (ref 3.87–5.11)
RDW: 14.2 % (ref 11.5–15.5)
WBC: 11.7 10*3/uL — ABNORMAL HIGH (ref 4.0–10.5)
nRBC: 0 % (ref 0.0–0.2)

## 2021-02-27 LAB — PROTEIN / CREATININE RATIO, URINE
Creatinine, Urine: 136.67 mg/dL
Protein Creatinine Ratio: 0.33 mg/mg{Cre} — ABNORMAL HIGH (ref 0.00–0.15)
Total Protein, Urine: 45 mg/dL

## 2021-02-27 LAB — TYPE AND SCREEN
ABO/RH(D): A POS
Antibody Screen: NEGATIVE

## 2021-02-27 LAB — RESP PANEL BY RT-PCR (FLU A&B, COVID) ARPGX2
Influenza A by PCR: NEGATIVE
Influenza B by PCR: NEGATIVE
SARS Coronavirus 2 by RT PCR: NEGATIVE

## 2021-02-27 LAB — POCT FERN TEST: POCT Fern Test: POSITIVE

## 2021-02-27 SURGERY — Surgical Case
Anesthesia: Spinal

## 2021-02-27 MED ORDER — OXYTOCIN-SODIUM CHLORIDE 30-0.9 UT/500ML-% IV SOLN
INTRAVENOUS | Status: AC
  Filled 2021-02-27: qty 500

## 2021-02-27 MED ORDER — PHENYLEPHRINE 40 MCG/ML (10ML) SYRINGE FOR IV PUSH (FOR BLOOD PRESSURE SUPPORT)
PREFILLED_SYRINGE | INTRAVENOUS | Status: AC
  Filled 2021-02-27: qty 10

## 2021-02-27 MED ORDER — PHENYLEPHRINE HCL (PRESSORS) 10 MG/ML IV SOLN
INTRAVENOUS | Status: DC | PRN
  Administered 2021-02-27: 80 ug via INTRAVENOUS
  Administered 2021-02-27: 120 ug via INTRAVENOUS
  Administered 2021-02-27: 80 ug via INTRAVENOUS

## 2021-02-27 MED ORDER — SODIUM CHLORIDE 0.9% FLUSH: 3.0000 mL | INTRAVENOUS | Status: DC | PRN

## 2021-02-27 MED ORDER — SIMETHICONE 80 MG PO CHEW: 80.0000 mg | CHEWABLE_TABLET | ORAL | Status: DC | PRN

## 2021-02-27 MED ORDER — SENNOSIDES-DOCUSATE SODIUM 8.6-50 MG PO TABS
2.0000 | ORAL_TABLET | ORAL | Status: DC
  Administered 2021-02-28 – 2021-03-02 (×3): 2 via ORAL
  Filled 2021-02-27 (×3): qty 2

## 2021-02-27 MED ORDER — COCONUT OIL OIL: 1.0000 "application " | TOPICAL_OIL | Status: DC | PRN

## 2021-02-27 MED ORDER — MEPERIDINE HCL 25 MG/ML IJ SOLN: 6.2500 mg | INTRAMUSCULAR | Status: DC | PRN

## 2021-02-27 MED ORDER — KETOROLAC TROMETHAMINE 30 MG/ML IJ SOLN: 30.0000 mg | Freq: Four times a day (QID) | INTRAMUSCULAR | Status: AC | PRN

## 2021-02-27 MED ORDER — DIPHENHYDRAMINE HCL 25 MG PO CAPS: 25.0000 mg | ORAL_CAPSULE | Freq: Four times a day (QID) | ORAL | Status: DC | PRN

## 2021-02-27 MED ORDER — SCOPOLAMINE 1 MG/3DAYS TD PT72
1.0000 | MEDICATED_PATCH | Freq: Once | TRANSDERMAL | Status: DC
  Administered 2021-02-27: 1.5 mg via TRANSDERMAL

## 2021-02-27 MED ORDER — ONDANSETRON HCL 4 MG/2ML IJ SOLN: 4.0000 mg | Freq: Three times a day (TID) | INTRAMUSCULAR | Status: DC | PRN

## 2021-02-27 MED ORDER — KETOROLAC TROMETHAMINE 30 MG/ML IJ SOLN
30.0000 mg | Freq: Four times a day (QID) | INTRAMUSCULAR | Status: AC
  Administered 2021-02-28 (×4): 30 mg via INTRAVENOUS
  Filled 2021-02-27 (×4): qty 1

## 2021-02-27 MED ORDER — OXYTOCIN-SODIUM CHLORIDE 30-0.9 UT/500ML-% IV SOLN
INTRAVENOUS | Status: DC | PRN
  Administered 2021-02-27: 300 mL via INTRAVENOUS

## 2021-02-27 MED ORDER — KETOROLAC TROMETHAMINE 30 MG/ML IJ SOLN
30.0000 mg | Freq: Four times a day (QID) | INTRAMUSCULAR | Status: AC | PRN
  Administered 2021-02-27: 30 mg via INTRAVENOUS

## 2021-02-27 MED ORDER — FENTANYL CITRATE (PF) 100 MCG/2ML IJ SOLN
INTRAMUSCULAR | Status: DC | PRN
  Administered 2021-02-27: 15 ug via INTRATHECAL

## 2021-02-27 MED ORDER — PHENYLEPHRINE HCL-NACL 20-0.9 MG/250ML-% IV SOLN
INTRAVENOUS | Status: AC
  Filled 2021-02-27: qty 250

## 2021-02-27 MED ORDER — PRENATAL MULTIVITAMIN CH
1.0000 | ORAL_TABLET | Freq: Every day | ORAL | Status: DC
  Administered 2021-02-28 – 2021-03-01 (×2): 1 via ORAL
  Filled 2021-02-27 (×2): qty 1

## 2021-02-27 MED ORDER — STERILE WATER FOR IRRIGATION IR SOLN
Status: DC | PRN
  Administered 2021-02-27: 1000 mL

## 2021-02-27 MED ORDER — DIPHENHYDRAMINE HCL 50 MG/ML IJ SOLN: 12.5000 mg | INTRAMUSCULAR | Status: DC | PRN

## 2021-02-27 MED ORDER — METOCLOPRAMIDE HCL 5 MG/ML IJ SOLN
INTRAMUSCULAR | Status: DC | PRN
  Administered 2021-02-27: 10 mg via INTRAVENOUS

## 2021-02-27 MED ORDER — ACETAMINOPHEN 10 MG/ML IV SOLN
INTRAVENOUS | Status: DC | PRN
  Administered 2021-02-27: 1000 mg via INTRAVENOUS

## 2021-02-27 MED ORDER — IBUPROFEN 600 MG PO TABS
600.0000 mg | ORAL_TABLET | Freq: Four times a day (QID) | ORAL | Status: DC
  Administered 2021-02-28 – 2021-03-02 (×6): 600 mg via ORAL
  Filled 2021-02-27 (×6): qty 1

## 2021-02-27 MED ORDER — SODIUM CHLORIDE 0.9 % IR SOLN
Status: DC | PRN
  Administered 2021-02-27: 1

## 2021-02-27 MED ORDER — NALBUPHINE HCL 10 MG/ML IJ SOLN: 5.0000 mg | Freq: Once | INTRAMUSCULAR | Status: DC | PRN

## 2021-02-27 MED ORDER — SIMETHICONE 80 MG PO CHEW
80.0000 mg | CHEWABLE_TABLET | Freq: Three times a day (TID) | ORAL | Status: DC
  Administered 2021-02-28 – 2021-03-02 (×7): 80 mg via ORAL
  Filled 2021-02-27 (×7): qty 1

## 2021-02-27 MED ORDER — MORPHINE SULFATE (PF) 0.5 MG/ML IJ SOLN
INTRAMUSCULAR | Status: DC | PRN
  Administered 2021-02-27: 150 ug via INTRATHECAL

## 2021-02-27 MED ORDER — ONDANSETRON HCL 4 MG/2ML IJ SOLN
INTRAMUSCULAR | Status: AC
  Filled 2021-02-27: qty 2

## 2021-02-27 MED ORDER — METOCLOPRAMIDE HCL 5 MG/ML IJ SOLN
INTRAMUSCULAR | Status: AC
  Filled 2021-02-27: qty 2

## 2021-02-27 MED ORDER — SCOPOLAMINE 1 MG/3DAYS TD PT72
MEDICATED_PATCH | TRANSDERMAL | Status: AC
  Filled 2021-02-27: qty 1

## 2021-02-27 MED ORDER — NALBUPHINE HCL 10 MG/ML IJ SOLN
5.0000 mg | INTRAMUSCULAR | Status: DC | PRN
  Administered 2021-02-27: 5 mg via INTRAVENOUS
  Filled 2021-02-27: qty 1

## 2021-02-27 MED ORDER — LACTATED RINGERS IV SOLN: INTRAVENOUS | Status: DC

## 2021-02-27 MED ORDER — NALBUPHINE HCL 10 MG/ML IJ SOLN: 5.0000 mg | INTRAMUSCULAR | Status: DC | PRN

## 2021-02-27 MED ORDER — OXYCODONE HCL 5 MG PO TABS
5.0000 mg | ORAL_TABLET | ORAL | Status: DC | PRN
  Administered 2021-02-28: 10 mg via ORAL
  Administered 2021-03-01: 5 mg via ORAL
  Administered 2021-03-01: 10 mg via ORAL
  Administered 2021-03-02 (×2): 5 mg via ORAL
  Filled 2021-02-27: qty 2
  Filled 2021-02-27 (×2): qty 1
  Filled 2021-02-27: qty 2
  Filled 2021-02-27: qty 1

## 2021-02-27 MED ORDER — NALBUPHINE HCL 10 MG/ML IJ SOLN
5.0000 mg | Freq: Once | INTRAMUSCULAR | Status: DC | PRN
Start: 2021-02-27 — End: 2021-03-02

## 2021-02-27 MED ORDER — LACTATED RINGERS IV SOLN: INTRAVENOUS | Status: DC | PRN

## 2021-02-27 MED ORDER — WITCH HAZEL-GLYCERIN EX PADS: 1.0000 "application " | MEDICATED_PAD | CUTANEOUS | Status: DC | PRN

## 2021-02-27 MED ORDER — DEXAMETHASONE SODIUM PHOSPHATE 4 MG/ML IJ SOLN
INTRAMUSCULAR | Status: AC
  Filled 2021-02-27: qty 2

## 2021-02-27 MED ORDER — ACETAMINOPHEN 10 MG/ML IV SOLN
INTRAVENOUS | Status: AC
  Filled 2021-02-27: qty 100

## 2021-02-27 MED ORDER — ONDANSETRON HCL 4 MG/2ML IJ SOLN
INTRAMUSCULAR | Status: DC | PRN
  Administered 2021-02-27: 4 mg via INTRAVENOUS

## 2021-02-27 MED ORDER — FENTANYL CITRATE (PF) 100 MCG/2ML IJ SOLN: 25.0000 ug | INTRAMUSCULAR | Status: DC | PRN

## 2021-02-27 MED ORDER — DIBUCAINE (PERIANAL) 1 % EX OINT: 1.0000 "application " | TOPICAL_OINTMENT | CUTANEOUS | Status: DC | PRN

## 2021-02-27 MED ORDER — PHENYLEPHRINE HCL (PRESSORS) 10 MG/ML IV SOLN: INTRAVENOUS | Status: DC | PRN

## 2021-02-27 MED ORDER — MORPHINE SULFATE (PF) 0.5 MG/ML IJ SOLN
INTRAMUSCULAR | Status: AC
  Filled 2021-02-27: qty 10

## 2021-02-27 MED ORDER — DEXAMETHASONE SODIUM PHOSPHATE 4 MG/ML IJ SOLN
INTRAMUSCULAR | Status: DC | PRN
  Administered 2021-02-27: 8 mg via INTRAVENOUS

## 2021-02-27 MED ORDER — KETOROLAC TROMETHAMINE 30 MG/ML IJ SOLN
INTRAMUSCULAR | Status: AC
  Filled 2021-02-27: qty 1

## 2021-02-27 MED ORDER — DIPHENHYDRAMINE HCL 25 MG PO CAPS: 25.0000 mg | ORAL_CAPSULE | ORAL | Status: DC | PRN

## 2021-02-27 MED ORDER — FENTANYL CITRATE (PF) 100 MCG/2ML IJ SOLN
INTRAMUSCULAR | Status: AC
  Filled 2021-02-27: qty 2

## 2021-02-27 MED ORDER — OXYTOCIN-SODIUM CHLORIDE 30-0.9 UT/500ML-% IV SOLN
2.5000 [IU]/h | INTRAVENOUS | Status: AC
  Administered 2021-02-27: 2.5 [IU]/h via INTRAVENOUS

## 2021-02-27 MED ORDER — NALOXONE HCL 4 MG/10ML IJ SOLN
1.0000 ug/kg/h | INTRAVENOUS | Status: DC | PRN
  Filled 2021-02-27: qty 5

## 2021-02-27 MED ORDER — ACETAMINOPHEN 500 MG PO TABS
1000.0000 mg | ORAL_TABLET | Freq: Four times a day (QID) | ORAL | Status: DC
  Administered 2021-02-27 – 2021-03-02 (×10): 1000 mg via ORAL
  Filled 2021-02-27 (×10): qty 2

## 2021-02-27 MED ORDER — PROMETHAZINE HCL 25 MG/ML IJ SOLN: 6.2500 mg | INTRAMUSCULAR | Status: DC | PRN

## 2021-02-27 MED ORDER — BUPIVACAINE IN DEXTROSE 0.75-8.25 % IT SOLN
INTRATHECAL | Status: DC | PRN
  Administered 2021-02-27: 1.6 mL via INTRATHECAL

## 2021-02-27 MED ORDER — DEXTROSE 5 % IV SOLN
INTRAVENOUS | Status: DC | PRN
  Administered 2021-02-27: 3 g via INTRAVENOUS

## 2021-02-27 MED ORDER — NALOXONE HCL 0.4 MG/ML IJ SOLN: 0.4000 mg | INTRAMUSCULAR | Status: DC | PRN

## 2021-02-27 MED ORDER — PHENYLEPHRINE HCL-NACL 20-0.9 MG/250ML-% IV SOLN
INTRAVENOUS | Status: DC | PRN
  Administered 2021-02-27: 60 ug/min via INTRAVENOUS

## 2021-02-27 MED ORDER — MENTHOL 3 MG MT LOZG: 1.0000 | LOZENGE | OROMUCOSAL | Status: DC | PRN

## 2021-02-27 SURGICAL SUPPLY — 42 items
APL SKNCLS STERI-STRIP NONHPOA (GAUZE/BANDAGES/DRESSINGS) ×1
BENZOIN TINCTURE PRP APPL 2/3 (GAUZE/BANDAGES/DRESSINGS) ×2 IMPLANT
CLAMP CORD UMBIL (MISCELLANEOUS) IMPLANT
CLOTH BEACON ORANGE TIMEOUT ST (SAFETY) ×2 IMPLANT
DRSG OPSITE POSTOP 4X10 (GAUZE/BANDAGES/DRESSINGS) ×2 IMPLANT
ELECT REM PT RETURN 9FT ADLT (ELECTROSURGICAL) ×2
ELECTRODE REM PT RTRN 9FT ADLT (ELECTROSURGICAL) ×1 IMPLANT
EXTRACTOR VACUUM KIWI (MISCELLANEOUS) IMPLANT
GAUZE SPONGE 4X4 12PLY STRL LF (GAUZE/BANDAGES/DRESSINGS) ×2 IMPLANT
GLOVE BIOGEL PI IND STRL 6.5 (GLOVE) ×1 IMPLANT
GLOVE BIOGEL PI IND STRL 7.0 (GLOVE) ×1 IMPLANT
GLOVE BIOGEL PI INDICATOR 6.5 (GLOVE) ×1
GLOVE BIOGEL PI INDICATOR 7.0 (GLOVE) ×1
GLOVE ECLIPSE 6.0 STRL STRAW (GLOVE) ×2 IMPLANT
GOWN STRL REUS W/TWL LRG LVL3 (GOWN DISPOSABLE) ×4 IMPLANT
HOVERMATT SINGLE USE (MISCELLANEOUS) ×1 IMPLANT
KIT ABG SYR 3ML LUER SLIP (SYRINGE) IMPLANT
NDL HYPO 25X5/8 SAFETYGLIDE (NEEDLE) IMPLANT
NEEDLE HYPO 25X5/8 SAFETYGLIDE (NEEDLE) IMPLANT
NS IRRIG 1000ML POUR BTL (IV SOLUTION) ×2 IMPLANT
PACK C SECTION WH (CUSTOM PROCEDURE TRAY) ×2 IMPLANT
PAD ABD 7.5X8 STRL (GAUZE/BANDAGES/DRESSINGS) ×1 IMPLANT
PAD OB MATERNITY 4.3X12.25 (PERSONAL CARE ITEMS) ×2 IMPLANT
PENCIL SMOKE EVAC W/HOLSTER (ELECTROSURGICAL) ×2 IMPLANT
RETRACTOR TRAXI PANNICULUS (MISCELLANEOUS) IMPLANT
RTRCTR C-SECT PINK 25CM LRG (MISCELLANEOUS) ×2 IMPLANT
STRIP CLOSURE SKIN 1/2X4 (GAUZE/BANDAGES/DRESSINGS) ×2 IMPLANT
SUT MNCRL 0 VIOLET CTX 36 (SUTURE) ×2 IMPLANT
SUT MNCRL AB 3-0 PS2 27 (SUTURE) ×2 IMPLANT
SUT MONOCRYL 0 CTX 36 (SUTURE) ×4
SUT PLAIN 0 NONE (SUTURE) IMPLANT
SUT PLAIN 2 0 (SUTURE) ×2
SUT PLAIN ABS 2-0 CT1 27XMFL (SUTURE) ×1 IMPLANT
SUT VIC AB 0 CTX 36 (SUTURE) ×4
SUT VIC AB 0 CTX36XBRD ANBCTRL (SUTURE) ×2 IMPLANT
SUT VIC AB 2-0 CT1 (SUTURE) ×1 IMPLANT
SUT VIC AB 2-0 CT1 27 (SUTURE) ×4
SUT VIC AB 2-0 CT1 TAPERPNT 27 (SUTURE) ×1 IMPLANT
TOWEL OR 17X24 6PK STRL BLUE (TOWEL DISPOSABLE) ×2 IMPLANT
TRAXI PANNICULUS RETRACTOR (MISCELLANEOUS) ×1
TRAY FOLEY W/BAG SLVR 14FR LF (SET/KITS/TRAYS/PACK) ×2 IMPLANT
WATER STERILE IRR 1000ML POUR (IV SOLUTION) ×2 IMPLANT

## 2021-02-27 NOTE — H&P (Signed)
35 y.o. G2P1001 @ [redacted]w[redacted]d with di mo twins presents from [facility] MFM for extended fetal monitoring.  She was seen for a routine BPP today.  Baby B had a BPP of 8/8, but baby A had a BPP of 4/8 (-2 for flexion, -2 gross movement).  Fluid was normal x 2.   In MAU, she was also noted to have multiple elevated blood pressures in the 140-150s/90s.  She had a documented BP of 142/91 at [redacted] weeks gestation.  She denies headache, visual changes, right upper quadrant or epigastric pain.  Fetal monitoring on presentation was category 1 x 2.  With reactive NST, baby B's testing now 6/10.  Discussed with Dr. Sherrie George who agrees with proceed with delivery given equivocal fetal testing and new onset gestational hypertension in the setting of monochorionic diamniotic twin pregnancy.  Patient reports baby B has been active today, but she is unsure if she has felt movement from baby A.       Pregnancy c/b: History of prior cesarean delivery for arrest of dilation:  desires repeat cesarean section Desires permanent sterilization Monochorionic diamniotic twin pregnancy.  Followed by [facility] MFM.  Most recent growht [place] on 6/20: A  2321g 43%;  Baby BB 2148g 22% .  Today position is cephalic / breech.   Obesity: pre-pregnancy BMI 36 History of PPH: records not available, did not require blood transfusion per patient report   Past Medical History:  Diagnosis Date   H pylori ulcer    H. pylori infection     Past Surgical History:  Procedure Laterality Date   CESAREAN SECTION     CHOLECYSTECTOMY      OB History  Gravida Para Term Preterm AB Living  2 1 1  0 0 1  SAB IAB Ectopic Multiple Live Births  0 0 0 0 1    # Outcome Date GA Lbr Len/2nd Weight Sex Delivery Anes PTL Lv  2 Current           1 Term 12/06/06 108w0d  3856 g M CS-Unspec   LIV    Social History   Socioeconomic History   Marital status: Single    Spouse name: Not on file   Number of children: Not on file   Years of education: Not on file    Highest education level: Not on file  Occupational History   Not on file  Tobacco Use   Smoking status: Former    Packs/day: 0.50    Years: 10.00    Pack years: 5.00    Types: Cigarettes   Smokeless tobacco: Never  Substance and Sexual Activity   Alcohol use: Not Currently    Comment: occasional   Drug use: No   Sexual activity: Yes    Birth control/protection: Implant  Other Topics Concern   Not on file  Social History Narrative   Not on file   Social Determinants of Health   Financial Resource Strain: Not on file  Food Insecurity: Not on file  Transportation Needs: Not on file  Physical Activity: Not on file  Stress: Not on file  Social Connections: Not on file  Intimate Partner Violence: Not on file   Morphine and related    Prenatal Transfer Tool  Maternal Diabetes: No Genetic Screening: Declined Maternal Ultrasounds/Referrals: Echogenic bowel x 2 Fetal Ultrasounds or other Referrals:  Referred to Materal Fetal Medicine  Maternal Substance Abuse:  No Significant Maternal Medications:  Meds include: Other:  Omeprazole Significant Maternal Lab Results: None  ABO, Rh: --/--/PENDING (07/06 1245) Antibody: PENDING (07/06 1245) Rubella:  Immune RPR: NON REACTIVE (06[birth year]01 1938)  HBsAg:   negative HIV:   negative GBS:   unknown   Vitals:   02/27/21 1301 02/27/21 1325  BP: (!) 143/84   Pulse: 86   Resp:    Temp:    SpO2:  99%     General:  NAD Abdomen:  soft, gravid FHTs:  category 1 x 2    A/P   35 y.o. G2P1001 [redacted]w[redacted]d with di-mo presents with equivocal fetal testing and new onset gestational hypertension.  Discussed plan of care with Dr. Sherrie George, [facility] MFM, who agrees with proceeding forward with delivery today GHTN:  Asymptomatic, no severe range BPs or labs.  Will monitor closely Patient declines TOLAC.  Plan RCS with permanent sterilization.  She desires bilateral salpingectomy.  She is amenable to tubal ligation or occlusion pending intraoperative  findings.  She understands the permanent nature of this procedure.  Discussed risks of cesarean section to include, but not limited to, infection, bleeding, damage to surrounding strutcures (including bowel, bladder, tubes, ovaries, nerves, vessels, baby), need for additional procedures, risk of blood clot, need for transfusion, risk of tubal failure, risk of regret. Consent signed Ancef 3 gm on call to OR  [name]

## 2021-02-27 NOTE — MAU Note (Signed)
Sent from office, baby A only got 4/8, baby was ok. Denies pain, bleeding or leaking.

## 2021-02-27 NOTE — Op Note (Signed)
Cesarean Section Procedure Note  Pre-operative Diagnosis: 1. Monochorionic diamniotic twin pregnancy at [redacted]w[redacted]d  2. Gestational hypertension  3. Premature preterm rupture of membranes  4. History of cesarean section, declines trial of labor 5. Desires permanent sterilization  Post-operative Diagnosis: same as above  Surgeon: Marlow Baars, MD  Procedure: Repeat low transverse cesarean section with bilateral salpingectomy  Anesthesia: Spinal anesthesia  Estimated Blood Loss: 780 mL         Drains: Foley catheter         Specimens: placenta and bilateral fallopian tubes to pathology               Complications:  None; patient tolerated the procedure well.         Disposition: PACU - hemodynamically stable.  Findings:  Normal uterus, tubes and ovaries bilaterally.  Twin A: viable  female infant delivered via the cephalic presentation, 2631g (5lb 12.8oz) Apgars 8, 9.   Twin B viable female infant delivered via the footling breech presentation 2469 gm (5lb 7.1oz) Apgars 8, 9.   Procedure Details   The patient was counseled about the risks, benefits, complications of the cesarean section as well as the permanent nature of a bilateral salpingectomy.  She elected to proceed with a tubal ligation. Consent was obtained.  Approximately 20 minutes prior to proceeding to the operating room, rupture of membranes with clear fluid occurred.    After spinal anesthesia was found to adequate , the patient was placed in the dorsal supine position with a leftward tilt, prepped and draped in the usual sterile manner. A Pfannenstiel incision was made and carried down through the subcutaneous tissue to the fascia.  The fascia was incised in the midline and the fascial incision was extended laterally with Mayo scissors. The superior aspect of the fascial incision was grasped with two Kocher clamp, tented up and the rectus muscles dissected off bluntly. The rectus was then dissected off with blunt dissection and Mayo  scissors inferiorly. The rectus muscles were separated in the midline. The abdominal peritoneum was identified, tented up, entered sharply, and the incision was extended superiorly and inferiorly with good visualization of the bladder. The vesicouterine peritoneum was identified, tented up, entered bluntly.  The Alexis retractor was deployed and the bladder flap was created digitally. A scalpel was then used to make a low transverse incision on the uterus which was extended laterally with blunt dissection. The fluid was clear. The fetal vertex of twin A was identified, elevated out of the pelvis and brought to the hysterotomy.  The head was delivered easily followed by the shoulders and body.  After a 60 second delay per protocol, the cord was clamped and cut and the infant was passed to the waiting neonatologist.  Twin B was then identified in the footling breech position.  Both feet were grasped, amniotomy performed with clear fluid, and the fetus delivered via the footling breech position via the usual breech maneuvers.  Placenta was then delivered spontaneously, intact and appear normal, the uterus was cleared of all clot and debris.   The hysterotomy was repaired with #0 Monocryl in running locked fashion.  A second imbricating layer was placed over the left half of the hysterotomy.   At this time, attention was turned to the left fallopian tube.  The tube was grasped with two babcock clamps.  The vessels in the mesosalpinx were sequentially clamped, transected with bovie cautery and tied with 2-0 vicryl free tie from the fimbriated end to the cornua.  The tube was clamped with a kelly clamp at the level of the cornua, transected and doubly ligated.  This was repeated with the right fallopian tube. The left and right fallopian tubes were sent to pathology.   The hysterotomy was reexamined and excellent hemostasis was noted.  The bilateral cornua and mesosalpinx were examined and were hemostatic.  Urine was  clear in the foley catheter. The abdominal cavity was cleared of all clot and debris.  The Alexis retractor was removed from the abdomen. The fascia and rectus muscles were inspected and were hemostatic. There peritoneum was closed with 2-0 vicryl in a running fashion.  The rectus muscles were closed with 2-0 vicryl in a running fashion. The fascia was closed with 0 Vicryl in a running fashion. The subcuticular layer was irrigated and all bleeders cauterized.  The subcutaneous layer was re approximated with interrupted 3-0 plain gut.  The skin was closed with 3-0 monocryl in a subcuticular fashion.  The incision was dressed with benzoine, steri strips and pressure dressing. All sponge lap and needle counts were correct x3. Patient tolerated the procedure well and recovered in stable condition following the procedure.

## 2021-02-27 NOTE — Transfer of Care (Signed)
Immediate Anesthesia Transfer of Care Note  Patient: [name]  Procedure(s) Performed: CESAREAN SECTION WITH BILATERAL TUBAL LIGATION  Patient Location: PACU  Anesthesia Type:Spinal  Level of Consciousness: awake  Airway & Oxygen Therapy: Patient Spontanous Breathing  Post-op Assessment: Report given to RN  Post vital signs: Reviewed and stable  Last Vitals:  Vitals Value Taken Time  BP 120/70 02/27/21 1901  Temp    Pulse 64 02/27/21 1902  Resp 13 02/27/21 1902  SpO2 97 % 02/27/21 1902  Vitals shown include unvalidated device data.  Last Pain:  Vitals:   02/27/21 1127  TempSrc: Oral         Complications: No notable events documented.

## 2021-02-27 NOTE — Lactation Note (Addendum)
This note was copied from a patient's chart. Lactation Consultation Note  Patient Name: [name] [id number] Date: 02/27/2021 Reason for consult: Initial assessment;Mother's request;Difficult latch;Multiple gestation;Infant < 6lbs Age:34 hours   Baby A Infant offered drops of colostrum at the breast and latched in football. Signs of milk transfer noted with breast compression. Mom denied any pain with the latch. Infant still latched and feeding at the end of the visit. LC returned to see how long infant latched for. Mom stated 6 minutes and then she gave 1 ml of EBM via spoon.   Babyu B infant not able to latch. LC tried suck training, infant very tight and biting down on the finger. With chin tug and suck training able to form a tight seal on my finger but not not latching at the breast. Infant given 3 ml of EBM via spoon/finger.   Reviewed with Mother option to supplement with DBM. Mom stated wanted to pump and see what volume gets first.   Plan 1. To feed based on cues 8-12x in 24 hr period no more than 3 hrs without an attempt. LPTI guidelines removed with Mother to reduce calorie loss including keeping infant STS, keeping hat on all times keeping total feeding under 30 minutes.          2. Mom wants to supplement with her EBM, LC set up DEBP for her to pump q3 hrs for 15 minutes. Mom to offer any EBM via spoon and finger feeding. Mom to offer EBM via spoon or finger feeding.             3. I and O sheet reviewed.              4. LC brochure of inpatient and outpatient services reviewed.    LC returned Mom completed one pumping session but only getting drops of colostrum. We talked about supplementation with options of DBM or formula. Mom on the way to the bathroom with RN. Mom will think it over and let RN know start supplementing.   LC will have IBCLC/RN [address] follow up with mother. LPTI supplementation guide provided.   Maternal Data Has patient been taught Hand Expression?:  Yes Does the patient have breastfeeding experience prior to this delivery?: Yes How long did the patient breastfeed?: 14 years ago for 2 months  Feeding Mother's Current Feeding Choice: Breast Milk  LATCH Score Latch: Repeated attempts needed to sustain latch, nipple held in mouth throughout feeding, stimulation needed to elicit sucking reflex.  Audible Swallowing: A few with stimulation  Type of Nipple: Everted at rest and after stimulation  Comfort (Breast/Nipple): Soft / non-tender  Hold (Positioning): Assistance needed to correctly position infant at breast and maintain latch.  LATCH Score: 7   Lactation Tools Discussed/Used Tools: Pump;Flanges Flange Size: 24 Breast pump type: Double-Electric Breast Pump Pump Education: Setup, frequency, and cleaning;Milk Storage Reason for Pumping: increase stimulation Pumping frequency: every 3 hrs for 15 min  Interventions Interventions: Breast feeding basics reviewed;Breast compression;Assisted with latch;Adjust position;Skin to skin;Support pillows;Breast massage;Position options;Hand express;Expressed milk;Education;DEBP  Discharge Pump: Personal  Consult Status Consult Status: Follow-up Date: 02/28/21 Follow-up type: In-patient    [name]  Nicholson-Springer 02/27/2021, 9:54 PM

## 2021-02-27 NOTE — Anesthesia Preprocedure Evaluation (Addendum)
Anesthesia Evaluation  Patient identified by MRN, date of birth, ID band Patient awake    Reviewed: Allergy & Precautions, NPO status , Patient's Chart, lab work & pertinent test results  History of Anesthesia Complications Negative for: history of anesthetic complications  Airway Mallampati: III   Neck ROM: Full    Dental   Pulmonary former smoker,    Pulmonary exam normal        Cardiovascular negative cardio ROS Normal cardiovascular exam     Neuro/Psych negative neurological ROS  negative psych ROS   GI/Hepatic Neg liver ROS, PUD,   Endo/Other  Morbid obesity  Renal/GU negative Renal ROS     Musculoskeletal negative musculoskeletal ROS (+)   Abdominal   Peds  Hematology  (+) anemia ,  Plt 274k    Anesthesia Other Findings   Reproductive/Obstetrics (+) Pregnancy  Twin gestation                             Anesthesia Physical Anesthesia Plan  ASA: 3  Anesthesia Plan: Spinal   Post-op Pain Management:    Induction:   PONV Risk Score and Plan: 2 and Treatment may vary due to age or medical condition, Ondansetron and Scopolamine patch - Pre-op  Airway Management Planned: Natural Airway  Additional Equipment: None  Intra-op Plan:   Post-operative Plan:   Informed Consent: I have reviewed the patients History and Physical, chart, labs and discussed the procedure including the risks, benefits and alternatives for the proposed anesthesia with the patient or authorized representative who has indicated his/her understanding and acceptance.       Plan Discussed with: CRNA and Anesthesiologist  Anesthesia Plan Comments: (Labs reviewed, platelets acceptable. Discussed risks and benefits of spinal, including spinal/epidural hematoma, infection, failed block, and PDPH. Patient expressed understanding and wished to proceed. )       Anesthesia Quick Evaluation

## 2021-02-27 NOTE — MAU Provider Note (Signed)
History     CSN: [id number]  Arrival date and time: 02/27/21 1054   Event Date/Time   First Provider Initiated Contact with Patient 02/27/21 1154     35 y.o. G2P1001 @36 .2 with mono/di twins sent from [facility] MFM for [facility] 4/8 on baby A. Pt reports +FM today but is unsure if she felt baby A. Pt reports a few elevated BPs earlier in pregnancy. Denies HA, visual disturbances, RUQ pain, SOB, and CP. She is scheduled for     OB History     Gravida  2   Para  1   Term  1   Preterm  0   AB  0   Living  1      SAB  0   IAB  0   Ectopic  0   Multiple  0   Live Births  1           Past Medical History:  Diagnosis Date   H pylori ulcer    H. pylori infection     Past Surgical History:  Procedure Laterality Date   CESAREAN SECTION     CHOLECYSTECTOMY      Family History  Problem Relation Age of Onset   Hypertension Mother    Diabetes Maternal Grandmother    Hypertension Maternal Grandmother     Social History   Tobacco Use   Smoking status: Former    Packs/day: 0.50    Years: 10.00    Pack years: 5.00    Types: Cigarettes   Smokeless tobacco: Never  Substance Use Topics   Alcohol use: Not Currently    Comment: occasional   Drug use: No    Allergies:  Allergies  Allergen Reactions   Morphine And Related Shortness Of Breath    Medications Prior to Admission  Medication Sig Dispense Refill Last Dose   aspirin EC 81 MG tablet Take 81 mg by mouth daily. Swallow whole.   02/26/2021   butalbital-acetaminophen-caffeine (FIORICET) 50-325-40 MG tablet Take 1 tablet by mouth every 4 (four) hours as needed.   Past Month   ferrous sulfate 325 (65 FE) MG tablet Take 1 tablet (325 mg total) by mouth every other day. 30 tablet 1 02/26/2021   omeprazole (PRILOSEC) 40 MG capsule Take 1 capsule by mouth daily.   02/26/2021   Prenatal Vit-Fe Fumarate-FA (M-NATAL PLUS) 27-1 MG TABS TAKE 1 TABLET BY MOUTH EVERY DAY 30 tablet 3 02/26/2021   acetaminophen (TYLENOL)  500 MG tablet Take 500 mg by mouth every 6 (six) hours as needed.      cyclobenzaprine (FLEXERIL) 5 MG tablet Take 1 tablet (5 mg total) by mouth 3 (three) times daily as needed (headache). 20 tablet 0    diphenhydrAMINE (BENADRYL) 25 mg capsule Take 1 capsule (25 mg total) by mouth every 6 (six) hours as needed. 30 capsule 0    docusate sodium (COLACE) 100 MG capsule Take 100 mg by mouth daily.      Magnesium Oxide 200 MG TABS Take 1 tablet (200 mg total) by mouth daily at 8 pm. 30 tablet 1    metoCLOPramide (REGLAN) 10 MG tablet Take 1 tablet (10 mg total) by mouth every 8 (eight) hours as needed (headache). 30 tablet 1     Review of Systems  Gastrointestinal:  Negative for abdominal pain.  Genitourinary:  Negative for vaginal bleeding.  Physical Exam   Blood pressure 129/79, pulse 81, temperature 97.9 F (36.6 C), temperature source Oral, resp. rate 18,  height 5\' 5"  (1.651 m), weight 121 kg, SpO2 98 %. Patient Vitals for the past 24 hrs:  BP Temp Temp src Pulse Resp SpO2 Height Weight  02/27/21 1325 -- -- -- -- -- 99 % -- --  02/27/21 1301 (!) 143/84 -- -- 86 -- -- -- --  02/27/21 1255 -- -- -- -- -- 99 % -- --  02/27/21 1250 -- -- -- -- -- 98 % -- --  02/27/21 1246 133/88 -- -- 80 -- -- -- --  02/27/21 1245 -- -- -- -- -- 98 % -- --  02/27/21 1240 -- -- -- -- -- 99 % -- --  02/27/21 1235 -- -- -- -- -- 98 % -- --  02/27/21 1231 (!) 155/91 -- -- 78 -- -- -- --  02/27/21 1230 -- -- -- -- -- 98 % -- --  02/27/21 1225 -- -- -- -- -- 97 % -- --  02/27/21 1220 -- -- -- -- -- 97 % -- --  02/27/21 1216 (!) 145/86 -- -- 76 -- -- -- --  02/27/21 1215 -- -- -- -- -- 98 % -- --  02/27/21 1210 -- -- -- -- -- 98 % -- --  02/27/21 1205 -- -- -- -- -- 98 % -- --  02/27/21 1204 (!) 149/93 -- -- 80 -- -- -- --  02/27/21 1200 -- -- -- -- -- 98 % -- --  02/27/21 1155 -- -- -- -- -- 98 % -- --  02/27/21 1150 -- -- -- -- -- 98 % -- --  02/27/21 1145 -- -- -- -- -- 97 % -- --  02/27/21 1144 (!)  152/79 -- -- 79 -- -- -- --  02/27/21 1127 129/79 97.9 F (36.6 C) Oral 81 18 98 % 5\' 5"  (1.651 m) 121 kg   Physical Exam Vitals and nursing note reviewed.  Constitutional:      General: She is not in acute distress.    Appearance: Normal appearance.  HENT:     Head: Normocephalic and atraumatic.  Cardiovascular:     Rate and Rhythm: Normal rate.  Pulmonary:     Effort: Pulmonary effort is normal. No respiratory distress.  Musculoskeletal:     Cervical back: Normal range of motion.  Skin:    General: Skin is warm and dry.  Neurological:     Mental Status: She is alert.  EFM-A: 135 bpm, mod variability, + accels, no decels EFM-B: 130 bpm, mod variability, + accels, no decels Toco: none  Results for orders placed or performed during the hospital encounter of 02/27/21 (from the past 24 hour(s))  Type and screen     Status: None (Preliminary result)   Collection Time: 02/27/21 12:45 PM  Result Value Ref Range   ABO/RH(D) PENDING    Antibody Screen PENDING    Sample Expiration      03/02/2021,2359 Performed at [facility], [address] [place], [address] [place]    MAU Course  Procedures  MDM Chart reviewed: pregnancy complicated by obesity, twins, and anemia. TC to Dr. [place] notified of presentation and clinical findings includi HTN, [place] [address] today and no current prenatal record available.  1320: Dr. Chestine Spore on unit, plan for CS today.   Assessment and Plan  [redacted] weeks gestation Twin pregnancy Reactive NST gHTN Admit to OR Mngt per Dr. [place], CNM 02/27/2021, 12:10 PM

## 2021-02-27 NOTE — Anesthesia Procedure Notes (Signed)
Spinal  Patient location during procedure: OR Start time: 02/27/2021 5:20 PM End time: 02/27/2021 5:23 PM Reason for block: surgical anesthesia Staffing Performed: anesthesiologist  Anesthesiologist: Beryle Lathe, MD Preanesthetic Checklist Completed: patient identified, IV checked, risks and benefits discussed, surgical consent, monitors and equipment checked, pre-op evaluation and timeout performed Spinal Block Patient position: sitting Prep: DuraPrep Patient monitoring: heart rate, cardiac monitor, continuous pulse ox and blood pressure Approach: midline Location: L3-4 Injection technique: single-shot Needle Needle type: Pencan  Needle gauge: 24 G Additional Notes Consent was obtained prior to the procedure with all questions answered and concerns addressed. Risks including, but not limited to, bleeding, infection, nerve damage, paralysis, failed block, inadequate analgesia, allergic reaction, high spinal, itching, and headache were discussed and the patient wished to proceed. Functioning IV was confirmed and monitors were applied. Sterile prep and drape, including hand hygiene, mask, and sterile gloves were used. The patient was positioned and the spine was prepped. The skin was anesthetized with lidocaine. Free flow of clear CSF was obtained prior to injecting local anesthetic into the CSF. The spinal needle aspirated freely following injection. The needle was carefully withdrawn. The patient tolerated the procedure well.   Leslye Peer, MD

## 2021-02-28 ENCOUNTER — Encounter (HOSPITAL_COMMUNITY): Payer: Self-pay | Admitting: Obstetrics

## 2021-02-28 LAB — CBC
HCT: 25.9 % — ABNORMAL LOW (ref 36.0–46.0)
Hemoglobin: 8.5 g/dL — ABNORMAL LOW (ref 12.0–15.0)
MCH: 29 pg (ref 26.0–34.0)
MCHC: 32.8 g/dL (ref 30.0–36.0)
MCV: 88.4 fL (ref 80.0–100.0)
Platelets: 248 10*3/uL (ref 150–400)
RBC: 2.93 MIL/uL — ABNORMAL LOW (ref 3.87–5.11)
RDW: 13.9 % (ref 11.5–15.5)
WBC: 17.1 10*3/uL — ABNORMAL HIGH (ref 4.0–10.5)
nRBC: 0 % (ref 0.0–0.2)

## 2021-02-28 LAB — RPR: RPR Ser Ql: NONREACTIVE

## 2021-02-28 NOTE — Lactation Note (Signed)
This note was copied from a baby's chart. Lactation Consultation Note  Patient Name: [name] [id number] Date: 02/28/2021 Reason for consult: Follow-up assessment;Multiple gestation;Late-preterm 34-36.6wks;Infant < 6lbs Age:34 hours  LC in to visit with P3 Mom of twins.  Mom holding both babies STS after their baths.  Assisted Mom with babies lying chest to chest as she was holding them STS on their sides.  Both babies are offered breast when they cue and Mom is supplementing babies with 10 ml 22 cal formula per LPT guidelines for supplementation.   DEBP set up at bedside, but Mom hasn't started pumping yet.  Mom is alone in room as FOB had to work today.    Encouraged Mom to ask for assistance with latching and/or pumping.  Explained importance of pumping to support her milk supply.   Mom has 2 pumps at home, one Medela and one Lansinoh DEBP.    Lactation Tools Discussed/Used Tools: Pump;Flanges;Bottle Flange Size: 24 Breast pump type: Double-Electric Breast Pump Pump Education: Setup, frequency, and cleaning Pumping frequency: 0 Pumped volume: 0 mL  Interventions Interventions: Breast feeding basics reviewed;Skin to skin;Breast massage;Hand express;DEBP;Hand pump;Education   Consult Status Consult Status: Follow-up Date: 03/01/21 Follow-up type: In-patient    [name] 02/28/2021, 12:45 PM

## 2021-02-28 NOTE — Progress Notes (Signed)
Post Partum Day 1 Subjective: Pain controlled, Foley catheter still in, denies nausea and vomiting, ambulating  Objective: Blood pressure 118/83, pulse 70, temperature 98.2 F (36.8 C), temperature source Oral, resp. rate 19, height 5\' 5"  (1.651 m), weight 121 kg, SpO2 98 %, unknown if currently breastfeeding.  Physical Exam:  General: alert, cooperative, and appears stated age Lochia: appropriate Uterine Fundus: firm Incision: dressing C/D/I DVT Evaluation: No evidence of DVT seen on physical exam.  Recent Labs    02/27/21 1227 02/28/21 0553  HGB 11.0* 8.5*  HCT 33.2* 25.9*    Assessment/Plan: Breastfeeding Routine Post-op care UOP borderline, foley not removed initially due to this. Bolus remaining fluid in IV bag and remove foley. Pt advised to continue po hydration Blood loss anemia, continue po iron   LOS: 1 day   05/01/21 02/28/2021, 11:13 AM

## 2021-02-28 NOTE — Anesthesia Postprocedure Evaluation (Signed)
Anesthesia Post Note  Patient: [name]  Procedure(s) Performed: CESAREAN SECTION WITH BILATERAL TUBAL LIGATION     Patient location during evaluation: PACU Anesthesia Type: Spinal Level of consciousness: oriented and awake and alert Pain management: pain level controlled Vital Signs Assessment: post-procedure vital signs reviewed and stable Respiratory status: spontaneous breathing, respiratory function stable and patient connected to nasal cannula oxygen Cardiovascular status: blood pressure returned to baseline and stable Postop Assessment: no headache, no backache and no apparent nausea or vomiting Anesthetic complications: no   No notable events documented.  Last Vitals:  Vitals:   02/27/21 2208 02/27/21 2315  BP: 108/88   Pulse: 71   Resp: 18 18  Temp: 36.8 C 36.8 C  SpO2: 98% 98%    Last Pain:  Vitals:   02/27/21 2315  TempSrc: Oral  PainSc: 4    Pain Goal:                   Vibhav Waddill

## 2021-02-28 NOTE — Lactation Note (Signed)
This note was copied from a baby's chart. Lactation Consultation Note Mom was informed of DM option but choose to use formula instead of DM.  This baby latched well after a couple of tries. Too formula in slow flow nipple at pace feeding well.  Encouraged mom to pump q3 hrs. Mom stated she didn't get anything. Explained normal doing for stimulation.  Praised mom for her hard work.  Patient Name: [name] OEHOZ'Y Date: 02/28/2021 Reason for consult: Follow-up assessment;Difficult latch;Late-preterm 34-36.6wks;Infant < 6lbs;Multiple gestation Age:23 hours  Maternal Data Has patient been taught Hand Expression?: Yes Does the patient have breastfeeding experience prior to this delivery?: Yes How long did the patient breastfeed?: few months  Feeding Mother's Current Feeding Choice: Breast Milk and Formula Nipple Type: Slow - flow  LATCH Score    Audible Swallowing: A few with stimulation  Type of Nipple: Everted at rest and after stimulation  Comfort (Breast/Nipple): Filling, red/small blisters or bruises, mild/mod discomfort (some edema)  Hold (Positioning): Assistance needed to correctly position infant at breast and maintain latch.      Lactation Tools Discussed/Used Tools: Shells;Pump Breast pump type: Double-Electric Breast Pump  Interventions Interventions: Breast feeding basics reviewed;Support pillows;Assisted with latch;Position options;Skin to skin;Breast massage;Hand express;Shells;Pre-pump if needed;DEBP;Breast compression;Adjust position  Discharge    Consult Status Consult Status: Follow-up Date: 02/28/21 Follow-up type: In-patient    Collette Pescador, Diamond Nickel 02/28/2021, 5:11 AM

## 2021-02-28 NOTE — Lactation Note (Signed)
This note was copied from a baby's chart. Lactation Consultation Note Mom holding baby when [facility] entered r. Mom stated she tried to latch abby but she wouldn't latch. LC saw baby's mouth was cyanotic in color. Placed baby in basinet and unwrapped. All extremities blue w/trunk area bluish as well.  Notified nurse. LC placed pulse ox on wrist. Took a few minutes but O2 came up to normal. Fluctuates at times to low 90's but then back up to 98-100%. RN took baby to CN to be monitored.  Patient Name: Girl Adora Yeh [id number] Date: 02/28/2021 Reason for consult: Follow-up assessment;Difficult latch;Late-preterm 34-36.6wks;Infant < 6lbs Age:34 hours  Maternal Data Has patient been taught Hand Expression?: Yes Does the patient have breastfeeding experience prior to this delivery?: Yes  Feeding Mother's Current Feeding Choice: Breast Milk and Formula  LATCH Score Latch: Too sleepy or reluctant, no latch achieved, no sucking elicited.  Audible Swallowing: None  Type of Nipple: Everted at rest and after stimulation  Comfort (Breast/Nipple): Filling, red/small blisters or bruises, mild/mod discomfort (some edema noted)  Hold (Positioning): Assistance needed to correctly position infant at breast and maintain latch.  LATCH Score: 4   Lactation Tools Discussed/Used    Interventions Interventions: Breast feeding basics reviewed;Support pillows;Assisted with latch;Position options;Skin to skin;Breast massage;Hand express;Shells;Pre-pump if needed;DEBP;Breast compression;Adjust position  Discharge    Consult Status Consult Status: Follow-up Date: 02/28/21 Follow-up type: In-patient    Olaf Mesa, Diamond Nickel 02/28/2021, 5:04 AM

## 2021-03-01 LAB — SURGICAL PATHOLOGY

## 2021-03-01 MED ORDER — TETANUS-DIPHTH-ACELL PERTUSSIS 5-2.5-18.5 LF-MCG/0.5 IM SUSY: 0.5000 mL | PREFILLED_SYRINGE | Freq: Once | INTRAMUSCULAR | Status: DC

## 2021-03-01 MED ORDER — FERROUS SULFATE 325 (65 FE) MG PO TABS
325.0000 mg | ORAL_TABLET | Freq: Every day | ORAL | Status: DC
  Administered 2021-03-02: 325 mg via ORAL
  Filled 2021-03-01: qty 1

## 2021-03-01 NOTE — Lactation Note (Signed)
This note was copied from a baby's chart. Lactation Consultation Note  Patient Name: [name] ELFYB'O Date: 03/01/2021 Reason for consult: Follow-up assessment Age:34 Hours   LC was paged to room to assist mother with breastfeeding Twins.   Mother has Baby B in her arms.  Assist mother with firming her nipple with a hand pump.  Reviewed hand expression . Observed good flow of colostrum.  Mothers nipple flat when compressed.  Mother was fit with a #20 NS. Infant refused breast. Several attempt to get infant to suckle on gloved finger. Infant was given a few drops of formula on her lips but no suckling.  Baby A placed on the breast  with as #20 NS , after just a few mins infant began suckling with a shallow latch, for 3-5 mins. Infant was given 1-2 ml of formula through the NS with a curved tip syringe.   Mother taught to apply the NS and care of the shield.  Mother advised to feed infants at least every 3 hours and with feeding cues.  Suggest that she call staff for assistance when needed.   Plan of Care : Breastfeed infant with feeding cues Supplement infant with ebm/formula, according to supplemental guidelines. Pump using a DEBP after each feeding for 15-20 mins.   Mother reports that she has  Medela and Lansinoh DEBP's at home. She is active with [facility] and reports that she plans to phone them that she has delivered.  Recommend that mother follow up with [facility] services when discharged from hospital.    Maternal Data    Feeding Mother's Current Feeding Choice: Breast Milk and Formula  LATCH Score                    Lactation Tools Discussed/Used    Interventions    Discharge Discharge Education: Outpatient recommendation WIC Program: Yes  Consult Status Consult Status: Follow-up Date: 03/01/21 Follow-up type: In-patient    Stevan Born [facility] 03/01/2021, 11:55 AM

## 2021-03-01 NOTE — Progress Notes (Signed)
Subjective: Postpartum Day 2: Cesarean Delivery [name] is doing well this morning. She reports incisional pain with movement that resolves with rest. She states she has been walking around room. She is voiding and tolerating PO. Lochia is minimal.   Objective: Patient Vitals for the past 24 hrs:  BP Temp Temp src Pulse Resp SpO2  03/01/21 0606 116/76 98 F (36.7 C) Oral 82 18 100 %  02/28/21 2028 111/61 98.5 F (36.9 C) Oral 72 18 96 %  02/28/21 1345 121/64 98.7 F (37.1 C) Oral 68 18 97 %  02/28/21 1059 118/83 98.2 F (36.8 C) Oral 70 19 98 %     Physical Exam:  General: alert, appears stated age, and no distress Lochia: appropriate Uterine Fundus: firm Incision: healing well, no significant drainage, no dehiscence, no significant erythema DVT Evaluation: No evidence of DVT seen on physical exam.  Recent Labs    02/27/21 1227 02/28/21 0553  HGB 11.0* 8.5*  HCT 33.2* 25.9*    Assessment/Plan:  Tkeya Stencil [id number] POD#2 s/p repeat cesarean and bilateral salpingectomy at [redacted]w[redacted]d, MCDA twins 1. PPC: continue routine postpartum care 2. Acute blood loss anemia: asymptomatic, start PO iron 3. Rh pos, rubella immune. Did not receive Tdap prenatally, will offer prior to discharge.  4. Dispo: anticipate d/c home tomorrow  Charlett Nose 03/01/2021, 10:36 AM

## 2021-03-02 MED ORDER — ACETAMINOPHEN 325 MG PO TABS: 625.0000 mg | ORAL_TABLET | Freq: Four times a day (QID) | ORAL | Status: DC | PRN

## 2021-03-02 MED ORDER — OXYCODONE HCL 5 MG PO TABS: 5.0000 mg | ORAL_TABLET | ORAL | 0 refills | Status: AC | PRN

## 2021-03-02 MED ORDER — IBUPROFEN 200 MG PO TABS: 600.0000 mg | ORAL_TABLET | Freq: Four times a day (QID) | ORAL | Status: DC | PRN

## 2021-03-02 NOTE — Progress Notes (Signed)
Subjective: Postpartum Day 3: Cesarean Delivery [name] is doing well this morning. She reports incisional pain with movement that resolves with rest. She states she has been walking around room. She is voiding and tolerating PO. Lochia is minimal.   Objective: Patient Vitals for the past 24 hrs:  BP Temp Temp src Pulse Resp SpO2  03/02/21 0646 136/87 98.2 F (36.8 C) Oral 67 18 100 %  03/01/21 2043 131/83 98.1 F (36.7 C) Oral 87 18 99 %  03/01/21 1500 131/77 98 F (36.7 C) Axillary 73 18 --    Physical Exam:  General: alert, cooperative, and no distress Lochia: appropriate Uterine Fundus: firm Incision: healing well, no significant drainage, no dehiscence, no significant erythema DVT Evaluation: No evidence of DVT seen on physical exam.  Recent Labs    02/27/21 1227 02/28/21 0553  HGB 11.0* 8.5*  HCT 33.2* 25.9*    Assessment/Plan:  [name] [id number] POD#3 s/p repeat cesarean and bilateral salpingectomy at [redacted]w[redacted]d, MCDA twins 1. PPC: continue routine postpartum care 2. Acute blood loss anemia: asymptomatic, continue PO iron 3. Rh pos, rubella immune. Did not receive Tdap prenatally, will offer prior to discharge. 4. Gestational HTN: normal Bps since delivery 5. Dispo: discharge home today, discharge instructions reviewed    Charlett Nose 03/02/2021, 8:25 AM

## 2021-03-02 NOTE — Discharge Summary (Signed)
Postpartum Discharge Summary  Date of Service updated 03/02/21     Patient Name: [name] DOB: Jun 08, 1986 MRN: [id number]  Date of admission: 02/27/2021 Delivery date:   Adiva, Boettner [[id number]]  02/27/2021    Haig Prophet, Girl Malissia [[id number]]  02/27/2021  Delivering provider:    Lennice Sites [[id number]]  Jerelyn Charles    Febres, Girl Juliann Pulse [[id number]]  Jerelyn Charles  Date of discharge: 03/02/2021  Admitting diagnosis: Gestational hypertension w/o significant proteinuria in 3rd trimester [O13.3] Intrauterine pregnancy: [redacted]w[redacted]d    Secondary diagnosis:  Active Problems:   Gestational hypertension w/o significant proteinuria in 3rd trimester  Additional problems: MCDA twins, equivocal fetal testing   Discharge diagnosis: Preterm Pregnancy Delivered, Gestational Hypertension, and Anemia                                              Post partum procedures: none Augmentation: N/A Complications: None  Hospital course: Sceduled C/S   35y.o. yo G2P1103 at 393w2das admitted to the hospital 02/27/2021 for scheduled cesarean section and bilateral salpingectomy with the following indication:Elective Repeat, Multifetal Gestation, and Gestational hypertension and equivocal fetal testing .Delivery details are as follows:  Membrane Rupture Time/Date:    LoZully, Frane0[[id number]]5:49 PM    Oser, Girl KaDonnica0[[id number]]4:35 PM ,   LoArin, Vanosdol0[[id number]]02/27/2021    Frisby, Girl KaSeraphim0[[id number]]02/27/2021   Delivery Method:   LoLennice Sites0[[id number]]C-Section, Low Transverse    Rehm, Girl KaEllena0[[id number]]C-Section, Low Transverse  Details of operation can be found in separate operative note.  Patient had an uncomplicated postpartum course. She has been normotensive since delivery. She is ambulating, tolerating a regular diet, passing flatus, and urinating well. Patient is discharged home in stable condition on   03/02/21        Newborn Data: Birth date:   LoLazara, Grieser0[[id number]]02/27/2021    LoHaig ProphetGirl KaProvidencia0[[id number]]02/27/2021  Birth time:   LoTamyka, Bezio0[[id number]]5:49 PM    LoHaig ProphetGirl KaAleesha0[[id number]]5:48 PM  Gender:   LoAutumm, Hattery0[[id number]]Female    Derflinger, Girl KaMartha0[[id number]]Female  Living status:   LoCeairra, Mccarver0[[id number]]Living    [address]Girl KaKamarii0[[id number]]Living  Apgars:   LoHajra, Port0[[id number]]8 [address]Girl KaJoscelin0[[id number]]8 [address] LoEloina, Ergle0[[id number]]9 [address]Girl KaIslah0[[id number]]9  Weight:   LoOvetta, Bazzano0[[id number]]  2800    Rains, Girl KaMargeret0[[id number]]2631 g     Magnesium Sulfate received: No BMZ received: No Rhophylac:N/A MMR:N/A T-DaP: plan prior to discharge Flu: No Transfusion:No  Physical exam  Vitals:   03/01/21 0606 03/01/21 1500 03/01/21 2043 03/02/21 0646  BP: 116/76 131/77 131/83 136/87  Pulse: 82 73 87 67  Resp: '18 18 18 18  ' Temp: 98 F (36.7 C) 98 F (36.7 C) 98.1 F (36.7 C) 98.2 F (36.8 C)  TempSrc: Oral Axillary Oral Oral  SpO2: 100%  99% 100%  Weight:      Height:       General: alert, cooperative, and no distress Lochia: appropriate Uterine Fundus: firm Incision: Healing well  with no significant drainage DVT Evaluation: No evidence of DVT seen on physical exam. Labs: Lab Results  Component Value Date   WBC 17.1 (H) 02/28/2021   HGB 8.5 (L) 02/28/2021   HCT 25.9 (L) 02/28/2021   MCV 88.4 02/28/2021   PLT 248 02/28/2021   CMP Latest Ref Rng & Units 02/27/2021  Glucose 70 - 99 mg/dL 72  BUN 6 - 20 mg/dL 8  Creatinine 0.44 - 1.00 mg/dL 0.53  Sodium 135 - 145 mmol/L 135  Potassium 3.5 - 5.1 mmol/L 4.1  Chloride 98 - 111 mmol/L 107  CO2 22 - 32 mmol/L 19(L)  Calcium 8.9 - 10.3 mg/dL 8.8(L)  Total Protein 6.5 - 8.1 g/dL 6.3(L)  Total Bilirubin 0.3 - 1.2 mg/dL 0.6  Alkaline  Phos 38 - 126 U/L 217(H)  AST 15 - 41 U/L 22  ALT 0 - 44 U/L 19   Edinburgh Score: Edinburgh Postnatal Depression Scale Screening Tool 03/01/2021  I have been able to laugh and see the funny side of things. 0  I have looked forward with enjoyment to things. 0  I have blamed myself unnecessarily when things went wrong. 0  I have been anxious or worried for no good reason. 0  I have felt scared or panicky for no good reason. 0  Things have been getting on top of me. 0  I have been so unhappy that I have had difficulty sleeping. 0  I have felt sad or miserable. 0  I have been so unhappy that I have been crying. 0  The thought of harming myself has occurred to me. 0  Edinburgh Postnatal Depression Scale Total 0      After visit meds:  Allergies as of 03/02/2021       Reactions   Morphine And Related Shortness Of Breath        Medication List     STOP taking these medications    aspirin EC 81 MG tablet   butalbital-acetaminophen-caffeine 50-325-40 MG tablet Commonly known as: FIORICET   cyclobenzaprine 5 MG tablet Commonly known as: FLEXERIL   diphenhydrAMINE 25 mg capsule Commonly known as: BENADRYL   Magnesium Oxide 200 MG Tabs   metoCLOPramide 10 MG tablet Commonly known as: REGLAN   omeprazole 40 MG capsule Commonly known as: PRILOSEC       TAKE these medications    acetaminophen 500 MG tablet Commonly known as: TYLENOL Take 500 mg by mouth every 6 (six) hours as needed. What changed: Another medication with the same name was added. Make sure you understand how and when to take each.   acetaminophen 325 MG tablet Commonly known as: TYLENOL Take 2 tablets (650 mg total) by mouth every 6 (six) hours as needed for moderate pain or mild pain. What changed: You were already taking a medication with the same name, and this prescription was added. Make sure you understand how and when to take each.   docusate sodium 100 MG capsule Commonly known as:  COLACE Take 100 mg by mouth daily.   ferrous sulfate 325 (65 FE) MG tablet Take 1 tablet (325 mg total) by mouth every other day.   ibuprofen 200 MG tablet Commonly known as: ADVIL Take 3 tablets (600 mg total) by mouth every 6 (six) hours as needed for cramping.   M-Natal Plus 27-1 MG Tabs TAKE 1 TABLET BY MOUTH EVERY DAY   oxyCODONE 5 MG immediate release tablet Commonly known as: Oxy IR/ROXICODONE Take 1 tablet (5 mg total)  by mouth every 4 (four) hours as needed for up to 3 days for severe pain.         Discharge home in stable condition Infant Feeding: Breast Infant Disposition:home with mother Discharge instruction: per After Visit Summary and Postpartum booklet. Activity: Advance as tolerated. Pelvic rest for 6 weeks.  Diet: low salt diet Anticipated Birth Control:  salpingectomy done at time of cesarean Postpartum Appointment:4 weeks Additional Postpartum F/U: none Future Appointments:No future appointments. Follow up Visit:  Follow-up Information     Jerelyn Charles, MD Follow up in 4 week(s).   Specialty: Obstetrics Contact information: [place] [address] [phone]                     03/02/2021 Rowland Lathe, MD

## 2021-03-04 ENCOUNTER — Other Ambulatory Visit (HOSPITAL_COMMUNITY): Payer: Medicaid Other

## 2021-03-04 ENCOUNTER — Encounter (HOSPITAL_COMMUNITY): Admission: RE | Admit: 2021-03-04 | Payer: Medicaid Other | Source: Ambulatory Visit

## 2021-03-06 ENCOUNTER — Encounter (HOSPITAL_COMMUNITY): Admission: RE | Payer: Self-pay | Source: Home / Self Care

## 2021-03-06 ENCOUNTER — Inpatient Hospital Stay (HOSPITAL_COMMUNITY)
Admission: RE | Admit: 2021-03-06 | Payer: Medicaid Other | Source: Home / Self Care | Admitting: Obstetrics & Gynecology

## 2021-03-06 SURGERY — Surgical Case
Anesthesia: Regional | Laterality: Bilateral

## 2021-03-12 ENCOUNTER — Telehealth (HOSPITAL_COMMUNITY): Payer: Self-pay | Admitting: *Deleted

## 2021-03-12 NOTE — Telephone Encounter (Signed)
Patient voiced no concerns regarding her own health. EPDS = 0. Patient voiced no concerns regarding her twins. Reported that infants sleep in individual bassinets on their backs. Deforest Hoyles, RN 03/12/21, 1640

## 2022-02-13 ENCOUNTER — Ambulatory Visit (INDEPENDENT_AMBULATORY_CARE_PROVIDER_SITE_OTHER): Payer: Medicaid Other | Admitting: Family Medicine

## 2022-02-13 ENCOUNTER — Encounter: Payer: Self-pay | Admitting: Family Medicine

## 2022-02-13 VITALS — BP 115/83 | HR 81 | Temp 98.1°F | Resp 16 | Ht 65.0 in | Wt 233.8 lb

## 2022-02-13 DIAGNOSIS — M25531 Pain in right wrist: Secondary | ICD-10-CM

## 2022-02-13 DIAGNOSIS — K219 Gastro-esophageal reflux disease without esophagitis: Secondary | ICD-10-CM | POA: Diagnosis not present

## 2022-02-13 DIAGNOSIS — Z7689 Persons encountering health services in other specified circumstances: Secondary | ICD-10-CM

## 2022-02-13 MED ORDER — MELOXICAM 15 MG PO TABS: 15.0000 mg | ORAL_TABLET | Freq: Every day | ORAL | 1 refills | Status: DC

## 2022-02-13 MED ORDER — OMEPRAZOLE 20 MG PO CPDR: 20.0000 mg | DELAYED_RELEASE_CAPSULE | Freq: Every day | ORAL | 3 refills | Status: DC

## 2022-02-13 NOTE — Progress Notes (Unsigned)
New Patient Office Visit  Subjective    Patient ID: [name], female    DOB: [birth year]  Age: 36 y.o. MRN: [id number]  CC:  Chief Complaint  Patient presents with   Establish Care    HPI [name] presents to establish care and for complaint of right wrist/hand pain. She reports that symptoms started right after pregnancy but is worsening. Patient is right hand dominant   Outpatient Encounter Medications as of 02/13/2022  Medication Sig   acetaminophen (TYLENOL) 325 MG tablet Take 2 tablets (650 mg total) by mouth every 6 (six) hours as needed for moderate pain or mild pain.   acetaminophen (TYLENOL) 500 MG tablet Take 500 mg by mouth every 6 (six) hours as needed. (Patient not taking: Reported on 02/13/2022)   docusate sodium (COLACE) 100 MG capsule Take 100 mg by mouth daily. (Patient not taking: Reported on 02/13/2022)   ferrous sulfate 325 (65 FE) MG tablet Take 1 tablet (325 mg total) by mouth every other day. (Patient not taking: Reported on 02/13/2022)   ibuprofen (ADVIL) 200 MG tablet Take 3 tablets (600 mg total) by mouth every 6 (six) hours as needed for cramping. (Patient not taking: Reported on 02/13/2022)   Prenatal Vit-Fe Fumarate-FA (M-NATAL PLUS) 27-1 MG TABS TAKE 1 TABLET BY MOUTH EVERY DAY (Patient not taking: Reported on 02/13/2022)   No facility-administered encounter medications on file as of 02/13/2022.    Past Medical History:  Diagnosis Date   GERD (gastroesophageal reflux disease) 2008   H pylori ulcer    H. pylori infection     Past Surgical History:  Procedure Laterality Date   CESAREAN SECTION     CESAREAN SECTION MULTI-GESTATIONAL WITH TUBAL N/A 02/27/2021   Procedure: CESAREAN SECTION MULTI-GESTATIONAL WITH TUBAL;  Surgeon: Marlow Baars, MD;  Location: [facility];  Service: Obstetrics;  Laterality: N/A;   CHOLECYSTECTOMY     TUBAL LIGATION  02/27/2021   Tubes removed    Family History  Problem Relation Age of Onset   Hypertension Mother     Arthritis Mother    Diabetes Maternal Grandmother    Hypertension Maternal Grandmother    Diabetes Father     Social History   Socioeconomic History   Marital status: Single    Spouse name: Not on file   Number of children: Not on file   Years of education: Not on file   Highest education level: Not on file  Occupational History   Not on file  Tobacco Use   Smoking status: Every Day    Packs/day: 0.50    Years: 10.00    Total pack years: 5.00    Types: Cigarettes   Smokeless tobacco: Never  Substance and Sexual Activity   Alcohol use: Not Currently    Comment: occasional   Drug use: No   Sexual activity: Yes    Birth control/protection: None  Other Topics Concern   Not on file  Social History Narrative   Not on file   Social Determinants of Health   Financial Resource Strain: Not on file  Food Insecurity: Not on file  Transportation Needs: Not on file  Physical Activity: Not on file  Stress: Not on file  Social Connections: Not on file  Intimate Partner Violence: Not on file    Review of Systems  All other systems reviewed and are negative.       Objective    BP 115/83   Pulse 81   Temp 98.1 F (36.7  C) (Oral)   Resp 16   Ht 5\' 5"  (1.651 m)   Wt 233 lb 12.8 oz (106.1 kg)   SpO2 94%   BMI 38.91 kg/m   Physical Exam Vitals and nursing note reviewed.  Constitutional:      General: She is not in acute distress. Cardiovascular:     Rate and Rhythm: Normal rate and regular rhythm.  Pulmonary:     Effort: Pulmonary effort is normal.     Breath sounds: Normal breath sounds.  Abdominal:     Palpations: Abdomen is soft.     Tenderness: There is no abdominal tenderness.  Neurological:     General: No focal deficit present.     Mental Status: She is alert and oriented to person, place, and time.     {Labs (Optional):23779}    Assessment & Plan:   Problem List Items Addressed This Visit   None Visit Diagnoses     Encounter to  establish care    -  Primary       No follow-ups on file.   , MD

## 2022-02-14 ENCOUNTER — Encounter: Payer: Self-pay | Admitting: Family Medicine

## 2022-03-06 ENCOUNTER — Ambulatory Visit: Payer: Medicaid Other | Attending: Family Medicine

## 2022-03-06 DIAGNOSIS — M6281 Muscle weakness (generalized): Secondary | ICD-10-CM | POA: Insufficient documentation

## 2022-03-06 DIAGNOSIS — M25531 Pain in right wrist: Secondary | ICD-10-CM | POA: Diagnosis not present

## 2022-03-06 NOTE — Therapy (Addendum)
OUTPATIENT PHYSICAL THERAPY UE EVALUATION  PHYSICAL THERAPY DISCHARGE SUMMARY  Visits from Start of Care: 1  Current functional level related to goals / functional outcomes: No re-assessment of goals.    Remaining deficits: Status unknown.    Education / Equipment: N/A   Patient agrees to discharge. Patient goals were not met. Patient is being discharged due to not returning since the last visit.    Patient Name: [name] MRN: [id number] DOB:February 23, 1986, 36 y.o., female Today's Date: 03/07/2022   PT End of Session - 03/06/22 1357     Visit Number 1    Number of Visits 7    Date for PT Re-Evaluation 04/19/22    Authorization Type [facility] [employer]    Authorization - Number of Visits 27    PT Start Time 1400    PT Stop Time 1438    PT Time Calculation (min) 38 min    Activity Tolerance Patient tolerated treatment well    Behavior During Therapy WFL for tasks assessed/performed             Past Medical History:  Diagnosis Date   GERD (gastroesophageal reflux disease) 2008   H pylori ulcer    H. pylori infection    Past Surgical History:  Procedure Laterality Date   CESAREAN SECTION     CESAREAN SECTION MULTI-GESTATIONAL WITH TUBAL N/A 02/27/2021   Procedure: CESAREAN SECTION MULTI-GESTATIONAL WITH TUBAL;  Surgeon: Jerelyn Charles, MD;  Location: [facility]S;  Service: Obstetrics;  Laterality: N/A;   CHOLECYSTECTOMY     TUBAL LIGATION  02/27/2021   Tubes removed   Patient Active Problem List   Diagnosis Date Noted   Gestational hypertension w/o significant proteinuria in 3rd trimester 02/27/2021   Previous cesarean delivery affecting pregnancy 09/17/2020   Obesity in pregnancy, antepartum 09/17/2020   Monochorionic diamniotic twin gestation 09/17/2020   COVID-19 05/04/2019   Pelvic pain in female 11/25/2012   Tobacco use 04/22/2012    PCP: Dorna Mai, MD  REFERRING PROVIDER: Dorna Mai, MD  REFERRING DIAG: [phone] (ICD-10-CM) - Right wrist  pain  THERAPY DIAG:  Pain in right wrist  Muscle weakness (generalized)  Rationale for Evaluation and Treatment Rehabilitation  ONSET DATE: chronic   SUBJECTIVE:                                                                                                                                                                                      SUBJECTIVE STATEMENT: She reports pain in her Rt wrist/hand if she is doing a lot of gripping especially with meal prepping or holding her babies. She was diagnosed with carpal tunnel during her pregnancy. She reports tingling  about the hand, but does not go into the fingers. Sometimes the pain sharp and sometimes it is throbbing. She reports this has been going on for about 4-5 years of insidious onset, but worsened during and after pregnancy (twins born July 2022). She reports occasional popping and clicking about the wrist and swelling if she is using her hand a lot. She has no history of neck or shoulder injury.   PERTINENT HISTORY: Reports history of carpal tunnel during pregnancy   PAIN:  Are you having pain? Yes: NPRS scale: 7/10 Pain location: dorsal and palmar aspect of the hand Pain description: throbbing Aggravating factors: gripping, twisting Relieving factors: rest  PRECAUTIONS: None  WEIGHT BEARING RESTRICTIONS No  FALLS:  Has patient fallen in last 6 months? 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)  PLOF: Independent  PATIENT GOALS Not have as much pain in it   OBJECTIVE:   DIAGNOSTIC FINDINGS:  None   PATIENT SURVEYS:  Quick Dash 36% disability   COGNITION:  Overall cognitive status: Within functional limits for tasks assessed     SENSATION: WFL  POSTURE: Forward head/rounded shoulders   UPPER EXTREMITY ROM:   Shoulder and cervical AROM WNL and pain free    Active ROM Right eval Left eval  Wrist flexion 70   Wrist extension 50   Wrist ulnar deviation 25   Wrist radial deviation 20   Wrist pronation 90   Wrist supination 90   (Blank rows = not tested)  UPPER EXTREMITY MMT:  MMT Right eval Left eval  Shoulder flexion    Shoulder extension    Shoulder abduction    Shoulder adduction    Shoulder internal rotation    Shoulder external rotation    Middle trapezius    Lower trapezius    Elbow flexion 5 5  Elbow extension 5 5  Wrist flexion 4- 5  Wrist extension 4- 5  Wrist ulnar deviation 4 5  Wrist radial deviation 4+ 5  Wrist pronation 4 5  Wrist supination 4 5  Grip strength (lbs) 30 increased pn 65  (Blank rows = not tested)  SPECIAL TESTS: (-) Finkelstein's  (-) Phalen's (-) Reverse Phalen's (-) Valgus/Varus Stress (+) TFCC compression test   FUNCTIONAL TESTING:  Increased pain along the medial wrist with wall push-up.   PALPATION:  TTP ulnar styloid, ulnocarpal joint    TODAY'S TREATMENT:  [facility] Adult PT Treatment:                                                DATE: 03/06/22 Therapeutic Exercise: Demonstrated and issue initial HEP.    Therapeutic Activity: Education on assessment findings that will be addressed throughout duration of POC.      PATIENT EDUCATION: Education details: see treatment Person educated: Patient Education method: Explanation, Demonstration, Tactile cues, Verbal cues, and Handouts Education comprehension: verbalized understanding, returned demonstration, verbal cues required, tactile cues required, and needs further education   HOME EXERCISE PROGRAM: Access Code: [email or web address] URL: [email or web address] Date: 03/06/2022 Prepared by: Gwendolyn Grant  Exercises - Putty Squeezes  - 1 x daily - 7 x weekly - 3 sets - 10 reps - Wrist Extension with Resistance  - 1 x daily - 7 x  weekly - 2 sets - 10 reps - Wrist Flexion with Resistance  - 1 x daily - 7 x weekly - 2 sets  - 10 reps  ASSESSMENT:  CLINICAL IMPRESSION: Patient is a 36 y.o. female who was seen today for physical therapy evaluation and treatment for chronic Rt wrist and hand pain of insidious onset that has been ongoing for the past 4-5 years. Upon assessment she is noted to have good ROM about the wrist, hand, shoulder, and neck without exacerbation of pain with any ROM activity. She has notable weakness about the Rt wrist, a positive TFCC compression test, and palpable tenderness about the ulnar styloid and ulnocarpal joint. She will benefit from skilled PT to address the above stated deficits in order to optimize her function.    OBJECTIVE IMPAIRMENTS decreased activity tolerance, decreased strength, impaired UE functional use, and pain.   ACTIVITY LIMITATIONS carrying, lifting, and caring for others  PARTICIPATION LIMITATIONS: meal prep, cleaning, laundry, shopping, and yard work  PERSONAL FACTORS Time since onset of injury/illness/exacerbation are also affecting patient's functional outcome.   REHAB POTENTIAL: Good  CLINICAL DECISION MAKING: Stable/uncomplicated  EVALUATION COMPLEXITY: Low   GOALS: Goals reviewed with patient? No  SHORT TERM GOALS: Target date: 03/28/2022   Patient will be independent and compliant with initial HEP.   Baseline: issued at eval Goal status: INITIAL  2.  Patient will voice understanding of measures to assist in pain reduction.  Baseline: educated on use of ice  Goal status: INITIAL  3.  Patient will demonstrate at least 40 lbs of Rt grip strength without an increase in pain to improve ability to complete meal prepping.  Baseline: 30 lbs  Goal status: INITIAL   LONG TERM GOALS: Target date: 04/18/2022   Patient will demonstrate 5/5 Rt wrist flexion and extension strength to improve ability lift and carry her infant twins.  Baseline: see above Goal status: INITIAL  2.  Patient will demonstrate 5/5 Rt radial and ulnar deviation strength to  improve ability to open jars.  Baseline: see above Goal status: INITIAL  3.  Patient will demonstrate 5/5 Rt pronation and supination strength to improve ability to complete cooking tasks.  Baseline: see above Goal status: INITIAL  4.  Patient will score </= 21% disability on the QuickDASH to signify clinically meaningful improvement in functional abilities.  Goal status: INITIAL   PLAN: PT FREQUENCY: 1x/week  PT DURATION: 6 weeks  PLANNED INTERVENTIONS: Therapeutic exercises, Therapeutic activity, Neuromuscular re-education, Patient/Family education, Self Care, Joint mobilization, Dry Needling, Cryotherapy, Moist heat, Taping, Ultrasound, Manual therapy, and Re-evaluation  PLAN FOR NEXT SESSION: review HEP, wrist strengthening, consider TPDN to thenar musculature, grip strengthening   Gwendolyn Grant, PT, DPT, ATC 03/07/22 8:31 AM  Check all possible CPT codes: 06004 - PT Re-evaluation, 97110- Therapeutic Exercise, [phone]- Neuro Re-education, 97140 - Manual Therapy, 41423 - Therapeutic Activities, 95320 - Self Care, and [phone] - Ultrasound     If treatment provided at initial evaluation, no treatment charged due to lack of authorization.      Gwendolyn Grant, PT, DPT, ATC 05/21/22 2:38 PM

## 2022-03-13 ENCOUNTER — Ambulatory Visit: Payer: Medicaid Other

## 2022-03-17 ENCOUNTER — Ambulatory Visit: Payer: Medicaid Other

## 2022-03-20 ENCOUNTER — Ambulatory Visit: Payer: Medicaid Other | Admitting: Family Medicine

## 2022-04-01 ENCOUNTER — Ambulatory Visit: Payer: Medicaid Other

## 2022-05-11 ENCOUNTER — Other Ambulatory Visit: Payer: Self-pay | Admitting: Family Medicine

## 2022-05-12 NOTE — Telephone Encounter (Signed)
Requested Prescriptions  Pending Prescriptions Disp Refills  . omeprazole (PRILOSEC) 20 MG capsule [Pharmacy Med Name: OMEPRAZOLE DR 20 MG CAPSULE] 90 capsule 1    Sig: TAKE 1 CAPSULE BY MOUTH EVERY DAY     Gastroenterology: Proton Pump Inhibitors Passed - 05/11/2022  9:24 AM      Passed - Valid encounter within last 12 months    Recent Outpatient Visits          2 months ago Right wrist pain   Primary Care at [facility], MD

## 2022-06-16 ENCOUNTER — Emergency Department (HOSPITAL_COMMUNITY): Payer: Medicaid Other

## 2022-06-16 ENCOUNTER — Encounter (HOSPITAL_COMMUNITY): Payer: Self-pay | Admitting: Emergency Medicine

## 2022-06-16 ENCOUNTER — Other Ambulatory Visit: Payer: Self-pay

## 2022-06-16 ENCOUNTER — Emergency Department (HOSPITAL_COMMUNITY)
Admission: EM | Admit: 2022-06-16 | Discharge: 2022-06-16 | Disposition: A | Discharge status: Home / Self Care | Payer: Medicaid Other | Attending: Emergency Medicine | Admitting: Emergency Medicine

## 2022-06-16 DIAGNOSIS — Z8616 Personal history of COVID-19: Secondary | ICD-10-CM | POA: Insufficient documentation

## 2022-06-16 DIAGNOSIS — Y92009 Unspecified place in unspecified non-institutional (private) residence as the place of occurrence of the external cause: Secondary | ICD-10-CM | POA: Insufficient documentation

## 2022-06-16 DIAGNOSIS — F17200 Nicotine dependence, unspecified, uncomplicated: Secondary | ICD-10-CM | POA: Insufficient documentation

## 2022-06-16 DIAGNOSIS — S52531A Colles' fracture of right radius, initial encounter for closed fracture: Secondary | ICD-10-CM | POA: Insufficient documentation

## 2022-06-16 DIAGNOSIS — W19XXXA Unspecified fall, initial encounter: Secondary | ICD-10-CM | POA: Insufficient documentation

## 2022-06-16 DIAGNOSIS — S6991XA Unspecified injury of right wrist, hand and finger(s), initial encounter: Secondary | ICD-10-CM | POA: Diagnosis present

## 2022-06-16 MED ORDER — OXYCODONE HCL 5 MG PO TABS: 2.5000 mg | ORAL_TABLET | Freq: Four times a day (QID) | ORAL | 0 refills | Status: AC | PRN

## 2022-06-16 MED ORDER — HYDROMORPHONE HCL 1 MG/ML IJ SOLN
1.0000 mg | Freq: Once | INTRAMUSCULAR | Status: AC
  Administered 2022-06-16: 1 mg via INTRAVENOUS
  Filled 2022-06-16: qty 1

## 2022-06-16 MED ORDER — SODIUM CHLORIDE 0.9 % IV SOLN
1000.0000 mL | INTRAVENOUS | Status: DC
  Administered 2022-06-16: 1000 mL via INTRAVENOUS

## 2022-06-16 MED ORDER — DIAZEPAM 5 MG/ML IJ SOLN
5.0000 mg | Freq: Once | INTRAMUSCULAR | Status: AC
  Administered 2022-06-16: 5 mg via INTRAVENOUS
  Filled 2022-06-16: qty 2

## 2022-06-16 MED ORDER — SODIUM CHLORIDE 0.9 % IV BOLUS (SEPSIS)
1000.0000 mL | Freq: Once | INTRAVENOUS | Status: AC
  Administered 2022-06-16: 1000 mL via INTRAVENOUS

## 2022-06-16 MED ORDER — FENTANYL CITRATE PF 50 MCG/ML IJ SOSY
75.0000 ug | PREFILLED_SYRINGE | Freq: Once | INTRAMUSCULAR | Status: AC
  Administered 2022-06-16: 75 ug via INTRAVENOUS
  Filled 2022-06-16: qty 2

## 2022-06-16 MED ORDER — HYDROMORPHONE HCL 1 MG/ML IJ SOLN
0.5000 mg | Freq: Once | INTRAMUSCULAR | Status: AC
  Administered 2022-06-16: 0.5 mg via INTRAVENOUS
  Filled 2022-06-16: qty 1

## 2022-06-16 NOTE — Discharge Instructions (Signed)
For pain control you may take at 1000 mg of Tylenol every 8 hours scheduled.  In addition you can take 0.5 to 1 tablet of Oxycodone every 6 hours as needed for pain not controlled with the scheduled Tylenol. ? ?

## 2022-06-16 NOTE — ED Triage Notes (Signed)
Patient lost her balance and fell at home this evening /landed on her right arm , presents with right wrist swelling/deformity , immobilized by EMS , she received Fentanyl 200 mcg IV and Ketamine 21 mg IV by EMS .

## 2022-06-16 NOTE — ED Provider Notes (Signed)
[facility] EMERGENCY DEPARTMENT Provider Note  CSN: [id number] Arrival date & time: 06/16/22 0104  Chief Complaint(s) Fall / Right Wrist Fx  HPI [name] is a 36 y.o. female with a past medical history listed below who presents to the emergency department with right wrist pain and deformity after mechanical fall at home.  She reports falling asleep on the couch, getting up and walking to her bedroom.  During this time she was reportedly pretty sleepy and she tried to put on sleeping shorts.  States that she felt her legs caught up in the shorts which caused her to fall.  She fell onto an outstretched right arm.  She denies any head trauma or loss of consciousness.  Denies any headache, neck pain, back pain, chest pain, hip pain or other extremity pain.  She did sustain a small abrasion to the right knee but denies any knee joint pain.  Patient has been ambulatory since the incident.  Right wrist pain is severe and throbbing, worse with movement and palpation.  Pain shoots up to the elbow.  Alleviated by mobility.  HPI  Past Medical History Past Medical History:  Diagnosis Date   GERD (gastroesophageal reflux disease) 2008   H pylori ulcer    H. pylori infection    Patient Active Problem List   Diagnosis Date Noted   Gestational hypertension w/o significant proteinuria in 3rd trimester 02/27/2021   Previous cesarean delivery affecting pregnancy 09/17/2020   Obesity in pregnancy, antepartum 09/17/2020   Monochorionic diamniotic twin gestation 09/17/2020   COVID-19 05/04/2019   Pelvic pain in female 11/25/2012   Tobacco use 04/22/2012   Home Medication(s) Prior to Admission medications   Medication Sig Start Date End Date Taking? Authorizing Provider  omeprazole (PRILOSEC) 20 MG capsule TAKE 1 CAPSULE BY MOUTH EVERY DAY 05/12/22  Yes Georganna Skeans, MD  oxyCODONE (ROXICODONE) 5 MG immediate release tablet Take 0.5-1 tablets (2.5-5 mg total) by mouth every 6  (six) hours as needed for up to 5 days for severe pain. 06/16/22 06/21/22 Yes Kelin Borum, Amadeo Garnet, MD  acetaminophen (TYLENOL) 325 MG tablet Take 2 tablets (650 mg total) by mouth every 6 (six) hours as needed for moderate pain or mild pain. 03/02/21   Charlett Nose, MD  ferrous sulfate 325 (65 FE) MG tablet Take 1 tablet (325 mg total) by mouth every other day. Patient not taking: Reported on 02/13/2022 01/23/21   Marylene Land, CNM  ibuprofen (ADVIL) 200 MG tablet Take 3 tablets (600 mg total) by mouth every 6 (six) hours as needed for cramping. Patient not taking: Reported on 02/13/2022 03/02/21   Charlett Nose, MD  meloxicam (MOBIC) 15 MG tablet Take 1 tablet (15 mg total) by mouth daily. Patient not taking: Reported on 06/16/2022 02/13/22   Georganna Skeans, MD  Prenatal Vit-Fe Fumarate-FA (M-NATAL PLUS) 27-1 MG TABS TAKE 1 TABLET BY MOUTH EVERY DAY Patient not taking: Reported on 02/13/2022 09/05/20   Tereso Newcomer, MD  Allergies Morphine and related  Review of Systems Review of Systems As noted in HPI  Physical Exam Vital Signs  I have reviewed the triage vital signs BP (!) 162/83   Pulse 72   Temp 98.7 F (37.1 C) (Oral)   Resp 18   LMP 05/07/2022   SpO2 99%   Physical Exam Vitals reviewed.  Constitutional:      General: She is not in acute distress.    Appearance: She is well-developed. She is not diaphoretic.  HENT:     Head: Normocephalic and atraumatic.     Right Ear: External ear normal.     Left Ear: External ear normal.     Nose: Nose normal.  Eyes:     General: No scleral icterus.       Right eye: No discharge.        Left eye: No discharge.     Conjunctiva/sclera: Conjunctivae normal.     Pupils: Pupils are equal, round, and reactive to light.  Neck:     Trachea: Phonation normal.  Cardiovascular:      Rate and Rhythm: Normal rate and regular rhythm.     Pulses:          Radial pulses are 2+ on the right side and 2+ on the left side.       Dorsalis pedis pulses are 2+ on the right side and 2+ on the left side.     Heart sounds: Normal heart sounds. No murmur heard.    No friction rub. No gallop.  Pulmonary:     Effort: Pulmonary effort is normal. No respiratory distress.     Breath sounds: Normal breath sounds. No stridor. No wheezing.  Abdominal:     General: There is no distension.     Palpations: Abdomen is soft.     Tenderness: There is no abdominal tenderness.  Musculoskeletal:     Right wrist: Swelling, deformity, tenderness and bony tenderness present. No lacerations or snuff box tenderness. Decreased range of motion. Normal pulse.     Left wrist: Normal pulse.     Cervical back: Normal range of motion and neck supple. No bony tenderness.     Thoracic back: No bony tenderness.     Lumbar back: No bony tenderness.     Comments: Clavicles stable. Chest stable to AP/Lat compression. Pelvis stable to Lat compression. No obvious extremity deformity. No chest or abdominal wall contusion.  Skin:    General: Skin is warm and dry.     Findings: No erythema or rash.  Neurological:     Mental Status: She is alert and oriented to person, place, and time.     Comments: Moving all extremities  Psychiatric:        Behavior: Behavior normal.     ED Results and Treatments Labs (all labs ordered are listed, but only abnormal results are displayed) Labs Reviewed - No data to display  EKG  EKG Interpretation  Date/Time:    Ventricular Rate:    PR Interval:    QRS Duration:   QT Interval:    QTC Calculation:   R Axis:     Text Interpretation:         Radiology DG Wrist Complete Right  Result Date: 06/16/2022 CLINICAL DATA:  Postreduction EXAM: RIGHT WRIST  - COMPLETE 3+ VIEW COMPARISON:  06/16/2022 FINDINGS: Improved position of fractures of the distal right radial metaphysis with some residual dorsal angulation and impaction. Ulnar styloid process fractures unchanged. Overlying cast material is present which obscures bone detail. IMPRESSION: Improved position of fractures of the distal right radial metaphysis postreduction. Some residual dorsal angulation and impaction is present. Electronically Signed   By: Lucienne Capers M.D.   On: 06/16/2022 03:59   DG Wrist Complete Right  Result Date: 06/16/2022 CLINICAL DATA:  Patient fell at home, landing on the right arm. Swelling and limited mobility to the wrist. Pain. EXAM: RIGHT WRIST - COMPLETE 3+ VIEW; RIGHT FOREARM - 2 VIEW; RIGHT ELBOW - COMPLETE 3+ VIEW COMPARISON:  Right hand 02/24/2010 FINDINGS: Four views of the right elbow, two views of the right forearm, and four views of the right wrist are obtained. Mostly transverse comminuted fractures of the distal right radial metaphysis with fracture lines extending to the radioulnar joint. There is dorsal angulation of the distal fracture fragments with some impaction. There is an associated ulnar styloid process fracture with displacement. Overlying soft tissue swelling. The proximal and mid right radius and ulna are otherwise intact. Right elbow is unremarkable. No evidence of acute fracture or dislocation. Joint spaces are normal. No significant effusion. IMPRESSION: 1. Transverse comminuted fractures of the distal right radial metaphysis with fracture of the ulnar styloid process. 2. Right elbow and right forearm are otherwise intact. Electronically Signed   By: Lucienne Capers M.D.   On: 06/16/2022 02:05   DG Forearm Right  Result Date: 06/16/2022 CLINICAL DATA:  Patient fell at home, landing on the right arm. Swelling and limited mobility to the wrist. Pain. EXAM: RIGHT WRIST - COMPLETE 3+ VIEW; RIGHT FOREARM - 2 VIEW; RIGHT ELBOW - COMPLETE 3+ VIEW  COMPARISON:  Right hand 02/24/2010 FINDINGS: Four views of the right elbow, two views of the right forearm, and four views of the right wrist are obtained. Mostly transverse comminuted fractures of the distal right radial metaphysis with fracture lines extending to the radioulnar joint. There is dorsal angulation of the distal fracture fragments with some impaction. There is an associated ulnar styloid process fracture with displacement. Overlying soft tissue swelling. The proximal and mid right radius and ulna are otherwise intact. Right elbow is unremarkable. No evidence of acute fracture or dislocation. Joint spaces are normal. No significant effusion. IMPRESSION: 1. Transverse comminuted fractures of the distal right radial metaphysis with fracture of the ulnar styloid process. 2. Right elbow and right forearm are otherwise intact. Electronically Signed   By: Lucienne Capers M.D.   On: 06/16/2022 02:05   DG ELBOW COMPLETE RIGHT (3+VIEW)  Result Date: 06/16/2022 CLINICAL DATA:  Patient fell at home, landing on the right arm. Swelling and limited mobility to the wrist. Pain. EXAM: RIGHT WRIST - COMPLETE 3+ VIEW; RIGHT FOREARM - 2 VIEW; RIGHT ELBOW - COMPLETE 3+ VIEW COMPARISON:  Right hand 02/24/2010 FINDINGS: Four views of the right elbow, two views of the right forearm, and four views of the right wrist are obtained. Mostly transverse comminuted fractures of the distal right  radial metaphysis with fracture lines extending to the radioulnar joint. There is dorsal angulation of the distal fracture fragments with some impaction. There is an associated ulnar styloid process fracture with displacement. Overlying soft tissue swelling. The proximal and mid right radius and ulna are otherwise intact. Right elbow is unremarkable. No evidence of acute fracture or dislocation. Joint spaces are normal. No significant effusion. IMPRESSION: 1. Transverse comminuted fractures of the distal right radial metaphysis with  fracture of the ulnar styloid process. 2. Right elbow and right forearm are otherwise intact. Electronically Signed   By: Burman NievesWilliam  Stevens M.D.   On: 06/16/2022 02:05    Medications Ordered in ED Medications  sodium chloride 0.9 % bolus 1,000 mL (0 mLs Intravenous Stopped 06/16/22 0208)    Followed by  0.9 %  sodium chloride infusion ( Intravenous Rate/Dose Change 06/16/22 0207)  HYDROmorphone (DILAUDID) injection 0.5 mg (0.5 mg Intravenous Given 06/16/22 0130)  HYDROmorphone (DILAUDID) injection 1 mg (1 mg Intravenous Given 06/16/22 0252)  diazepam (VALIUM) injection 5 mg (5 mg Intravenous Given 06/16/22 0252)  fentaNYL (SUBLIMAZE) injection 75 mcg (75 mcg Intravenous Given 06/16/22 0319)                                                                                                                                     Procedures .Ortho Injury Treatment  Date/Time: 06/16/2022 4:49 AM  Performed by: Nira Connardama, Tenoch Mcclure Eduardo, MD Authorized by: Nira Connardama, Graden Hoshino Eduardo, MD   Consent:    Consent obtained:  Verbal   Consent given by:  Patient   Risks discussed:  Nerve damage, restricted joint movement, vascular damage, stiffness, recurrent dislocation and irreducible dislocation   Alternatives discussed:  ImmobilizationInjury location: wrist Location details: right wrist Injury type: fracture-dislocation Pre-procedure neurovascular assessment: neurovascularly intact  Patient sedated: Yes. Refer to sedation procedure documentation for details of sedation. Manipulation performed: yes Skeletal traction used: yes Reduction successful: partially. X-ray confirmed reduction: yes Immobilization: splint Splint type: sugar tong Splint Applied by: ED Provider and Ortho Tech Supplies used: Ortho-Glass Post-procedure neurovascular assessment: post-procedure neurovascularly intact     (including critical care time)  Medical Decision Making / ED Course   Medical Decision Making Amount and/or  Complexity of Data Reviewed Radiology: ordered.  Risk Prescription drug management.     Complexity of Problem:  Patient's presenting problem/concern, DDX, and MDM listed below: Mechanical fall resulting in right wrist deformity Concerning for acute closed fracture/dislocation of the right wrist We will get imaging to rule out additional fractures of the right arm.   Initial Intervention:  IV fluids and pain medicine    Complexity of Data:     Imaging Studies ordered listed below with my independent interpretation: Confirmed distal radius and ulnar fracture, colles' type. No other injuries.     ED Course:    Assessment, Add'l Intervention, and Reassessment: Distal radius and ulnar fracture Reduced partially as above and splinted Neurovascular intact  distally Will require follow-up with orthopedic surgery     Final Clinical Impression(s) / ED Diagnoses Final diagnoses:  Closed Colles' fracture of right radius, initial encounter   The patient appears reasonably screened and/or stabilized for discharge and I doubt any other medical condition or other [facility] requiring further screening, evaluation, or treatment in the ED at this time. I have discussed the findings, Dx and Tx plan with the patient/family who expressed understanding and agree(s) with the plan. Discharge instructions discussed at length. The patient/family was given strict return precautions who verbalized understanding of the instructions. No further questions at time of discharge.  Disposition: Discharge  Condition: Good  ED Discharge Orders          Ordered    oxyCODONE (ROXICODONE) 5 MG immediate release tablet  Every 6 hours PRN        06/16/22 0449            [facility] narcotic database reviewed and no active prescriptions noted.   Follow Up: Georganna Skeans, MD [address] [place] [address] [phone]  Call  to schedule an appointment for close follow  up  Bradly Bienenstock, MD [address] [place] [place] [address] [phone]  Call  to schedule an appointment for close follow up           This chart was dictated using voice recognition software.  Despite best efforts to proofread,  errors can occur which can change the documentation meaning.    Nira Conn, MD 06/16/22 [phone]

## 2022-06-16 NOTE — Progress Notes (Signed)
Orthopedic Tech Progress Note Patient Details:  [name] 1985-08-30 [id number]  Ortho Devices Type of Ortho Device: Arm sling, Sugartong splint, Finger trap Finger Trap Weight: 5 lbs Ortho Device/Splint Location: rue Ortho Device/Splint Interventions: Ordered, Application, Adjustment  I applied the finger traps at drs request to 5lbs. After the patient was in the traps for 10 mins the dr reduced the fracture then I applied the splint with the drs assist. Post Interventions Patient Tolerated: Fair Instructions Provided: Care of device, Adjustment of device  Karolee Stamps 06/16/2022, 4:08 AM

## 2022-06-19 DIAGNOSIS — S52561A Barton's fracture of right radius, initial encounter for closed fracture: Secondary | ICD-10-CM | POA: Diagnosis not present

## 2022-06-20 ENCOUNTER — Other Ambulatory Visit: Payer: Self-pay

## 2022-06-20 ENCOUNTER — Encounter (HOSPITAL_COMMUNITY): Payer: Self-pay | Admitting: Orthopedic Surgery

## 2022-06-20 NOTE — H&P (Addendum)
[name] is an 36 y.o. female.   Chief Complaint: RIGHT WRIST PAIN   HPI: The patient is a 36 year old right-hand dominant female who fell on 06/16/22 landing on the right wrist.  She was seen in the emergency department and underwent a closed reduction of the distal radius with splinting.  She was seen in our office for further evaluation.  She continues to have weakness, tingling, swelling, stiffness, and pain.  Discussed the continued displacement of the fracture and the reason and rationale for surgical intervention. She is here today for surgery. She denies chest pain, shortness of breath, fever, chills, nausea, vomiting, diarrhea.  Past Medical History:  Diagnosis Date   GERD (gastroesophageal reflux disease) 2008   H pylori ulcer    H. pylori infection     Past Surgical History:  Procedure Laterality Date   CESAREAN SECTION     CESAREAN SECTION MULTI-GESTATIONAL WITH TUBAL N/A 02/27/2021   Procedure: CESAREAN SECTION MULTI-GESTATIONAL WITH TUBAL;  Surgeon: Marlow Baars, MD;  Location: [facility] LD ORS;  Service: Obstetrics;  Laterality: N/A;   CHOLECYSTECTOMY     TUBAL LIGATION  02/27/2021   Tubes removed    Family History  Problem Relation Age of Onset   Hypertension Mother    Arthritis Mother    Diabetes Maternal Grandmother    Hypertension Maternal Grandmother    Diabetes Father    Social History:  reports that she has been smoking cigarettes. She has a 5.00 pack-year smoking history. She has never used smokeless tobacco. She reports that she does not currently use alcohol. She reports that she does not use drugs.  Allergies:  Allergies  Allergen Reactions   Morphine And Related Shortness Of Breath    No medications prior to admission.    No results found for this or any previous visit (from the past 48 hour(s)). No results found.  ROS NO RECENT ILLNESSES OR HOSPITALIZATIONS  Last menstrual period 05/07/2022, unknown if currently breastfeeding. Physical Exam   General Appearance:  Alert, cooperative, no distress, appears stated age  Head:  Normocephalic, without obvious abnormality, atraumatic  Eyes:  Pupils equal, conjunctiva/corneas clear,         Throat: Lips, mucosa, and tongue normal; teeth and gums normal  Neck: No visible masses     Lungs:   respirations unlabored  Chest Wall:  No tenderness or deformity  Heart:  Regular rate and rhythm,  Abdomen:   Soft, non-tender,         Extremities: SWELLING OF THE FREE DIGITS WITHOUT ERYTHEMA OR OPEN WOUNDS. SENSATION INTACT TO LIGHT TOUCH DISTALLY. CAPILLARY REFILL LESS THAN 2 SECONDS. ABLE TO GENTLY WIGGLE ALL DIGITS. TENDERNESS OF THE DISTAL RADIUS.  Pulses: 2+ and symmetric  Skin: Skin color, texture, turgor normal, no rashes or lesions     Neurologic: Normal     Assessment/Plan RIGHT DISTAL RADIUS INTRAARTICULAR FRACTURE      - RIGHT DISTAL RADIUS OPEN REDUCTION AND INTERNAL FIXATION WITH REPAIR AS INDICATED  R/B/A DISCUSSED WITH PT IN OFFICE.  PT VOICED UNDERSTANDING OF PLAN CONSENT SIGNED DAY OF SURGERY PT SEEN AND EXAMINED PRIOR TO OPERATIVE PROCEDURE/DAY OF SURGERY SITE MARKED. QUESTIONS ANSWERED WILL GO HOME FOLLOWING SURGERY   WE ARE PLANNING SURGERY FOR YOUR UPPER EXTREMITY. THE RISKS AND BENEFITS OF SURGERY INCLUDE BUT NOT LIMITED TO BLEEDING INFECTION, DAMAGE TO NEARBY NERVES ARTERIES TENDONS, FAILURE OF SURGERY TO ACCOMPLISH ITS INTENDED GOALS, PERSISTENT SYMPTOMS AND NEED FOR FURTHER SURGICAL INTERVENTION. WITH THIS IN MIND WE WILL PROCEED.  I HAVE DISCUSSED WITH THE PATIENT THE PRE AND POSTOPERATIVE REGIMEN AND THE DOS AND DON'TS. PT VOICED UNDERSTANDING AND INFORMED CONSENT SIGNED.   Bradly Bienenstock MD 06/23/22   Karma Greaser 06/20/2022, 9:09 AM

## 2022-06-20 NOTE — Progress Notes (Signed)
PCP - denies Cardiologist - denies  PPM/ICD - denies  Chest x-ray - n/a EKG - 05/20/12  CPAP - n/a  Fasting Blood Sugar - n/a  Blood Thinner Instructions: n/a Aspirin Instructions: Patient was instructed: As of today, STOP taking any Aspirin (unless otherwise instructed by your surgeon) Aleve, Naproxen, Ibuprofen, Motrin, Advil, Goody's, BC's, all herbal medications, fish oil, and all vitamins.  ERAS Protcol - yes, until 09:15 o'clock  COVID TEST- n/a  Anesthesia review: no  Patient verbally denies any shortness of breath, fever, cough and chest pain during phone call   -------------  SDW INSTRUCTIONS given:  Your procedure is scheduled on Monday, October 30th, 2023.  Report to [facility] Main Entrance "A" at 09:45 A.M., and check in at the Admitting office.  Call this number if you have problems the morning of surgery:  [phone]   Remember:  Do not eat after midnight the night before your surgery  You may drink clear liquids until 09:15 the morning of your surgery.   Clear liquids allowed are: Water, Non-Citrus Juices (without pulp), Carbonated Beverages, Clear Tea, Black Coffee Only, and Gatorade    Take these medicines the morning of surgery with A SIP OF WATER : Prilosec PRN: Tylenol, Oxycodone   The day of surgery:                     Do not wear jewelry, make up, or nail polish            Do not wear lotions, powders, perfumes, or deodorant.            Do not shave 48 hours prior to surgery.              Do not bring valuables to the hospital.            [facility] is not responsible for any belongings or valuables.  Do NOT Smoke (Tobacco/Vaping) 24 hours prior to your procedure If you use a CPAP at night, you may bring all equipment for your overnight stay.   Contacts, glasses, dentures or bridgework may not be worn into surgery.      For patients admitted to the hospital, discharge time will be determined by your treatment team.   Patients  discharged the day of surgery will not be allowed to drive home, and someone needs to stay with them for 24 hours.    Special instructions:   [facility]- Preparing For Surgery  Before surgery, you can play an important role. Because skin is not sterile, your skin needs to be as free of germs as possible. You can reduce the number of germs on your skin by washing with CHG (chlorahexidine gluconate) Soap before surgery.  CHG is an antiseptic cleaner which kills germs and bonds with the skin to continue killing germs even after washing.    Oral Hygiene is also important to reduce your risk of infection.  Remember - BRUSH YOUR TEETH THE MORNING OF SURGERY WITH YOUR REGULAR TOOTHPASTE  Please do not use if you have an allergy to CHG or antibacterial soaps. If your skin becomes reddened/irritated stop using the CHG.  Do not shave (including legs and underarms) for at least 48 hours prior to first CHG shower. It is OK to shave your face.  Please follow these instructions carefully.   Shower the NIGHT BEFORE SURGERY and the MORNING OF SURGERY with DIAL Soap.   Pat yourself dry with a CLEAN TOWEL.  Wear  CLEAN PAJAMAS to bed the night before surgery  Place CLEAN SHEETS on your bed the night of your first shower and DO NOT SLEEP WITH PETS.   Day of Surgery: Please shower morning of surgery  Wear Clean/Comfortable clothing the morning of surgery Do not apply any deodorants/lotions.   Remember to brush your teeth WITH YOUR REGULAR TOOTHPASTE.   Questions were answered. Patient verbalized understanding of instructions.

## 2022-06-23 ENCOUNTER — Ambulatory Visit (HOSPITAL_BASED_OUTPATIENT_CLINIC_OR_DEPARTMENT_OTHER): Payer: Medicaid Other | Admitting: Anesthesiology

## 2022-06-23 ENCOUNTER — Encounter (HOSPITAL_COMMUNITY)
Admission: RE | Disposition: A | Discharge status: Home / Self Care | Payer: Self-pay | Source: Ambulatory Visit | Attending: Orthopedic Surgery

## 2022-06-23 ENCOUNTER — Encounter (HOSPITAL_COMMUNITY): Payer: Self-pay | Admitting: Orthopedic Surgery

## 2022-06-23 ENCOUNTER — Other Ambulatory Visit: Payer: Self-pay

## 2022-06-23 ENCOUNTER — Ambulatory Visit (HOSPITAL_COMMUNITY)
Admission: RE | Admit: 2022-06-23 | Discharge: 2022-06-23 | Disposition: A | Discharge status: Home / Self Care | Payer: Medicaid Other | Source: Ambulatory Visit | Attending: Orthopedic Surgery | Admitting: Orthopedic Surgery

## 2022-06-23 ENCOUNTER — Ambulatory Visit (HOSPITAL_COMMUNITY): Payer: Medicaid Other | Admitting: Anesthesiology

## 2022-06-23 ENCOUNTER — Ambulatory Visit (HOSPITAL_COMMUNITY): Payer: Medicaid Other

## 2022-06-23 DIAGNOSIS — S52501A Unspecified fracture of the lower end of right radius, initial encounter for closed fracture: Secondary | ICD-10-CM

## 2022-06-23 DIAGNOSIS — W19XXXA Unspecified fall, initial encounter: Secondary | ICD-10-CM | POA: Diagnosis not present

## 2022-06-23 DIAGNOSIS — I10 Essential (primary) hypertension: Secondary | ICD-10-CM | POA: Insufficient documentation

## 2022-06-23 DIAGNOSIS — F17210 Nicotine dependence, cigarettes, uncomplicated: Secondary | ICD-10-CM | POA: Diagnosis not present

## 2022-06-23 DIAGNOSIS — S52601A Unspecified fracture of lower end of right ulna, initial encounter for closed fracture: Secondary | ICD-10-CM

## 2022-06-23 DIAGNOSIS — S52571A Other intraarticular fracture of lower end of right radius, initial encounter for closed fracture: Secondary | ICD-10-CM | POA: Diagnosis not present

## 2022-06-23 DIAGNOSIS — S52561A Barton's fracture of right radius, initial encounter for closed fracture: Secondary | ICD-10-CM | POA: Insufficient documentation

## 2022-06-23 HISTORY — PX: OPEN REDUCTION INTERNAL FIXATION (ORIF) DISTAL RADIAL FRACTURE: SHX5989

## 2022-06-23 LAB — CBC
HCT: 37.1 % (ref 36.0–46.0)
Hemoglobin: 11.7 g/dL — ABNORMAL LOW (ref 12.0–15.0)
MCH: 27.5 pg (ref 26.0–34.0)
MCHC: 31.5 g/dL (ref 30.0–36.0)
MCV: 87.1 fL (ref 80.0–100.0)
Platelets: 427 10*3/uL — ABNORMAL HIGH (ref 150–400)
RBC: 4.26 MIL/uL (ref 3.87–5.11)
RDW: 13.6 % (ref 11.5–15.5)
WBC: 9.9 10*3/uL (ref 4.0–10.5)
nRBC: 0 % (ref 0.0–0.2)

## 2022-06-23 LAB — POCT PREGNANCY, URINE: Preg Test, Ur: NEGATIVE

## 2022-06-23 SURGERY — OPEN REDUCTION INTERNAL FIXATION (ORIF) DISTAL RADIUS FRACTURE
Anesthesia: General | Laterality: Right

## 2022-06-23 MED ORDER — BUPIVACAINE-EPINEPHRINE (PF) 0.5% -1:200000 IJ SOLN
INTRAMUSCULAR | Status: DC | PRN
  Administered 2022-06-23: 25 mL via PERINEURAL

## 2022-06-23 MED ORDER — LACTATED RINGERS IV SOLN: INTRAVENOUS | Status: DC

## 2022-06-23 MED ORDER — CHLORHEXIDINE GLUCONATE 0.12 % MT SOLN
15.0000 mL | Freq: Once | OROMUCOSAL | Status: AC
  Administered 2022-06-23: 15 mL via OROMUCOSAL
  Filled 2022-06-23: qty 15

## 2022-06-23 MED ORDER — PROPOFOL 10 MG/ML IV BOLUS
INTRAVENOUS | Status: DC | PRN
  Administered 2022-06-23: 200 mg via INTRAVENOUS

## 2022-06-23 MED ORDER — FENTANYL CITRATE (PF) 100 MCG/2ML IJ SOLN: 50.0000 ug | Freq: Once | INTRAMUSCULAR | Status: AC

## 2022-06-23 MED ORDER — MIDAZOLAM HCL 2 MG/2ML IJ SOLN
INTRAMUSCULAR | Status: AC
  Filled 2022-06-23: qty 2

## 2022-06-23 MED ORDER — FENTANYL CITRATE (PF) 100 MCG/2ML IJ SOLN
INTRAMUSCULAR | Status: AC
  Administered 2022-06-23: 50 ug via INTRAVENOUS
  Filled 2022-06-23: qty 2

## 2022-06-23 MED ORDER — MIDAZOLAM HCL 2 MG/2ML IJ SOLN
INTRAMUSCULAR | Status: DC | PRN
  Administered 2022-06-23: 1 mg via INTRAVENOUS

## 2022-06-23 MED ORDER — DEXAMETHASONE SODIUM PHOSPHATE 10 MG/ML IJ SOLN
INTRAMUSCULAR | Status: DC | PRN
  Administered 2022-06-23: 8 mg via INTRAVENOUS

## 2022-06-23 MED ORDER — PROPOFOL 10 MG/ML IV BOLUS
INTRAVENOUS | Status: AC
  Filled 2022-06-23: qty 20

## 2022-06-23 MED ORDER — ORAL CARE MOUTH RINSE: 15.0000 mL | Freq: Once | OROMUCOSAL | Status: AC

## 2022-06-23 MED ORDER — FENTANYL CITRATE (PF) 250 MCG/5ML IJ SOLN
INTRAMUSCULAR | Status: AC
  Filled 2022-06-23: qty 5

## 2022-06-23 MED ORDER — ONDANSETRON HCL 4 MG/2ML IJ SOLN
INTRAMUSCULAR | Status: DC | PRN
  Administered 2022-06-23: 4 mg via INTRAVENOUS

## 2022-06-23 MED ORDER — MIDAZOLAM HCL 2 MG/2ML IJ SOLN
INTRAMUSCULAR | Status: AC
  Administered 2022-06-23: 2 mg via INTRAVENOUS
  Filled 2022-06-23: qty 2

## 2022-06-23 MED ORDER — BUPIVACAINE HCL (PF) 0.25 % IJ SOLN
INTRAMUSCULAR | Status: AC
  Filled 2022-06-23: qty 30

## 2022-06-23 MED ORDER — CEFAZOLIN SODIUM-DEXTROSE 2-4 GM/100ML-% IV SOLN
2.0000 g | INTRAVENOUS | Status: AC
  Administered 2022-06-23: 2 g via INTRAVENOUS
  Filled 2022-06-23: qty 100

## 2022-06-23 MED ORDER — OXYCODONE-ACETAMINOPHEN 10-325 MG PO TABS: 1.0000 | ORAL_TABLET | Freq: Four times a day (QID) | ORAL | 0 refills | Status: AC | PRN

## 2022-06-23 MED ORDER — MIDAZOLAM HCL 2 MG/2ML IJ SOLN: 2.0000 mg | Freq: Once | INTRAMUSCULAR | Status: AC

## 2022-06-23 MED ORDER — LIDOCAINE 2% (20 MG/ML) 5 ML SYRINGE
INTRAMUSCULAR | Status: DC | PRN
  Administered 2022-06-23: 40 mg via INTRAVENOUS

## 2022-06-23 MED ORDER — FENTANYL CITRATE (PF) 250 MCG/5ML IJ SOLN
INTRAMUSCULAR | Status: DC | PRN
  Administered 2022-06-23: 100 ug via INTRAVENOUS

## 2022-06-23 MED ORDER — DOCUSATE SODIUM 100 MG PO CAPS: 100.0000 mg | ORAL_CAPSULE | Freq: Two times a day (BID) | ORAL | 0 refills | Status: DC

## 2022-06-23 SURGICAL SUPPLY — 67 items
BAG COUNTER SPONGE SURGICOUNT (BAG) ×2 IMPLANT
BAG SPNG CNTER NS LX DISP (BAG) ×1
BIT DRILL 2.2 SS TIBIAL (BIT) IMPLANT
BLADE CLIPPER SURG (BLADE) IMPLANT
BNDG CMPR 9X4 STRL LF SNTH (GAUZE/BANDAGES/DRESSINGS) ×2
BNDG CMPR STD VLCR NS LF 5.8X4 (GAUZE/BANDAGES/DRESSINGS) ×1
BNDG ELASTIC 3X5.8 VLCR STR LF (GAUZE/BANDAGES/DRESSINGS) ×2 IMPLANT
BNDG ELASTIC 4X5.8 VLCR NS LF (GAUZE/BANDAGES/DRESSINGS) IMPLANT
BNDG ELASTIC 4X5.8 VLCR STR LF (GAUZE/BANDAGES/DRESSINGS) ×2 IMPLANT
BNDG ESMARK 4X9 LF (GAUZE/BANDAGES/DRESSINGS) ×2 IMPLANT
BNDG GAUZE DERMACEA FLUFF 4 (GAUZE/BANDAGES/DRESSINGS) ×2 IMPLANT
BNDG GZE DERMACEA 4 6PLY (GAUZE/BANDAGES/DRESSINGS) ×1
CANISTER SUCT 3000ML PPV (MISCELLANEOUS) ×2 IMPLANT
CORD BIPOLAR FORCEPS 12FT (ELECTRODE) ×2 IMPLANT
COVER SURGICAL LIGHT HANDLE (MISCELLANEOUS) ×2 IMPLANT
CUFF TOURN SGL QUICK 18X4 (TOURNIQUET CUFF) ×2 IMPLANT
CUFF TOURN SGL QUICK 24 (TOURNIQUET CUFF)
CUFF TRNQT CYL 24X4X16.5-23 (TOURNIQUET CUFF) IMPLANT
DRAPE OEC MINIVIEW 54X84 (DRAPES) ×2 IMPLANT
DRAPE SURG 17X11 SM STRL (DRAPES) ×2 IMPLANT
DRSG ADAPTIC 3X8 NADH LF (GAUZE/BANDAGES/DRESSINGS) ×2 IMPLANT
GAUZE 4X4 16PLY ~~LOC~~+RFID DBL (SPONGE) ×2 IMPLANT
GAUZE SPONGE 4X4 12PLY STRL (GAUZE/BANDAGES/DRESSINGS) ×2 IMPLANT
GAUZE XEROFORM 5X9 LF (GAUZE/BANDAGES/DRESSINGS) ×2 IMPLANT
GLOVE BIOGEL PI IND STRL 8.5 (GLOVE) ×2 IMPLANT
GLOVE SURG ORTHO 8.0 STRL STRW (GLOVE) ×2 IMPLANT
GOWN STRL REUS W/ TWL LRG LVL3 (GOWN DISPOSABLE) ×2 IMPLANT
GOWN STRL REUS W/ TWL XL LVL3 (GOWN DISPOSABLE) ×2 IMPLANT
GOWN STRL REUS W/TWL LRG LVL3 (GOWN DISPOSABLE) ×1
GOWN STRL REUS W/TWL XL LVL3 (GOWN DISPOSABLE) ×1
K-WIRE 1.6 (WIRE) ×2
K-WIRE FX5X1.6XNS BN SS (WIRE) ×2
KIT BASIN OR (CUSTOM PROCEDURE TRAY) ×2 IMPLANT
KIT TURNOVER KIT B (KITS) ×2 IMPLANT
KWIRE FX5X1.6XNS BN SS (WIRE) IMPLANT
NDL HYPO 25X1 1.5 SAFETY (NEEDLE) ×2 IMPLANT
NEEDLE HYPO 25X1 1.5 SAFETY (NEEDLE) ×1 IMPLANT
NS IRRIG 1000ML POUR BTL (IV SOLUTION) ×2 IMPLANT
PACK ORTHO EXTREMITY (CUSTOM PROCEDURE TRAY) ×2 IMPLANT
PAD ARMBOARD 7.5X6 YLW CONV (MISCELLANEOUS) ×4 IMPLANT
PAD CAST 4YDX4 CTTN HI CHSV (CAST SUPPLIES) ×2 IMPLANT
PADDING CAST COTTON 4X4 STRL (CAST SUPPLIES) ×2
PEG LOCKING SMOOTH 2.2X16 (Screw) IMPLANT
PEG LOCKING SMOOTH 2.2X20 (Screw) IMPLANT
PEG LOCKING SMOOTH 2.2X22 (Screw) IMPLANT
PLATE DVR CROSSLOCK STD RT (Plate) IMPLANT
SCREW LOCK 14X2.7X 3 LD TPR (Screw) IMPLANT
SCREW LOCK 20X2.7X 3 LD TPR (Screw) IMPLANT
SCREW LOCKING 2.7X14 (Screw) ×3 IMPLANT
SCREW LOCKING 2.7X15MM (Screw) IMPLANT
SCREW LOCKING 2.7X20MM (Screw) ×2 IMPLANT
SLING ARM FOAM STRAP LRG (SOFTGOODS) IMPLANT
SOAP 2 % CHG 4 OZ (WOUND CARE) ×2 IMPLANT
SPIKE FLUID TRANSFER (MISCELLANEOUS) ×2 IMPLANT
SPONGE T-LAP 4X18 ~~LOC~~+RFID (SPONGE) ×2 IMPLANT
SUT MNCRL AB 4-0 PS2 18 (SUTURE) IMPLANT
SUT PROLENE 4 0 PS 2 18 (SUTURE) IMPLANT
SUT VIC AB 2-0 CT1 27 (SUTURE)
SUT VIC AB 2-0 CT1 TAPERPNT 27 (SUTURE) IMPLANT
SUT VIC AB 2-0 CTB1 (SUTURE) IMPLANT
SUT VICRYL 4-0 PS2 18IN ABS (SUTURE) IMPLANT
SYR CONTROL 10ML LL (SYRINGE) IMPLANT
TOWEL GREEN STERILE (TOWEL DISPOSABLE) ×2 IMPLANT
TOWEL GREEN STERILE FF (TOWEL DISPOSABLE) IMPLANT
TUBE CONNECTING 12X1/4 (SUCTIONS) ×2 IMPLANT
WATER STERILE IRR 1000ML POUR (IV SOLUTION) ×2 IMPLANT
YANKAUER SUCT BULB TIP NO VENT (SUCTIONS) IMPLANT

## 2022-06-23 NOTE — Transfer of Care (Signed)
Immediate Anesthesia Transfer of Care Note  Patient: [name]  Procedure(s) Performed: OPEN REDUCTION INTERNAL FIXATION (ORIF) DISTAL RADIUS FRACTURE, and repair as indicated (Right)  Patient Location: PACU  Anesthesia Type:GA combined with regional for post-op pain  Level of Consciousness: awake, alert  and oriented  Airway & Oxygen Therapy: Patient Spontanous Breathing and Patient connected to nasal cannula oxygen  Post-op Assessment: Report given to RN and Post -op Vital signs reviewed and stable  Post vital signs: Reviewed and stable  Last Vitals:  Vitals Value Taken Time  BP 118/72 06/23/22 1305  Temp 36.4 C 06/23/22 1305  Pulse 75 06/23/22 1306  Resp 9 06/23/22 1306  SpO2 100 % 06/23/22 1306  Vitals shown include unvalidated device data.  Last Pain:  Vitals:   06/23/22 1026  TempSrc:   PainSc: 8       Patients Stated Pain Goal: 4 (69/79/48 0165)  Complications: No notable events documented.

## 2022-06-23 NOTE — Discharge Instructions (Signed)
[facility]EEP [facility] CALL OFFICE FOR F/U APPT [phone] in 15 days Rx sent to [place] [place] TO APPLY ICE TO OPERATIVE AREA CONTACT OFFICE IF ANY WORSENING PAIN OR CONCERNS.

## 2022-06-23 NOTE — Anesthesia Postprocedure Evaluation (Signed)
Anesthesia Post Note  Patient: [name]  Procedure(s) Performed: OPEN REDUCTION INTERNAL FIXATION (ORIF) DISTAL RADIUS FRACTURE, and repair as indicated (Right)     Patient location during evaluation: PACU Anesthesia Type: General Level of consciousness: awake Pain management: pain level controlled Vital Signs Assessment: post-procedure vital signs reviewed and stable Respiratory status: spontaneous breathing Postop Assessment: no apparent nausea or vomiting Anesthetic complications: no   No notable events documented.  Last Vitals:  Vitals:   06/23/22 1315 06/23/22 1330  BP: 106/61 117/81  Pulse: 78 72  Resp: 12 14  Temp:  36.5 C  SpO2: 100% 98%    Last Pain:  Vitals:   06/23/22 1330  TempSrc:   PainSc: 0-No pain                 Carlas Vandyne

## 2022-06-23 NOTE — Anesthesia Procedure Notes (Addendum)
Anesthesia Regional Block: Supraclavicular block   Pre-Anesthetic Checklist: , timeout performed,  Correct Patient, Correct Site, Correct Laterality,  Correct Procedure, Correct Position, site marked,  Risks and benefits discussed,  Surgical consent,  Pre-op evaluation,  At surgeon's request  Laterality: Right  Prep: chloraprep       Needles:  Injection technique: Single-shot  Needle Type: Stimulator Needle - 40          Additional Needles:   Procedures: Doppler guided, nerve stimulator,,, ultrasound used (permanent image in chart),,     Nerve Stimulator or Paresthesia:  Response: 0.5 mA  Additional Responses:   Narrative:  Start time: 06/23/2022 11:25 AM End time: 06/23/2022 11:45 AM Injection made incrementally with aspirations every 5 mL.  Performed by: Other  Anesthesiologist: Belinda Block, MD

## 2022-06-23 NOTE — Anesthesia Preprocedure Evaluation (Signed)
Anesthesia Evaluation  Patient identified by MRN, date of birth, ID band Patient awake    Reviewed: Allergy & Precautions, NPO status , Patient's Chart, lab work & pertinent test results  Airway Mallampati: II       Dental   Pulmonary Current Smoker and Patient abstained from smoking.   breath sounds clear to auscultation       Cardiovascular hypertension,  Rhythm:Regular Rate:Normal     Neuro/Psych    GI/Hepatic Neg liver ROS, PUD,GERD  ,,  Endo/Other  negative endocrine ROS    Renal/GU negative Renal ROS     Musculoskeletal   Abdominal   Peds  Hematology   Anesthesia Other Findings   Reproductive/Obstetrics                             Anesthesia Physical Anesthesia Plan  ASA: 1  Anesthesia Plan: General   Post-op Pain Management: Regional block*   Induction:   PONV Risk Score and Plan: 3 and Ondansetron, Dexamethasone and Midazolam  Airway Management Planned: LMA  Additional Equipment:   Intra-op Plan:   Post-operative Plan: Extubation in OR  Informed Consent: I have reviewed the patients History and Physical, chart, labs and discussed the procedure including the risks, benefits and alternatives for the proposed anesthesia with the patient or authorized representative who has indicated his/her understanding and acceptance.     Dental advisory given  Plan Discussed with: CRNA and Anesthesiologist  Anesthesia Plan Comments:        Anesthesia Quick Evaluation

## 2022-06-23 NOTE — Anesthesia Procedure Notes (Signed)
Procedure Name: LMA Insertion Date/Time: 06/23/2022 12:05 PM  Performed by: Mariea Clonts, CRNAPre-anesthesia Checklist: Patient identified, Emergency Drugs available, Suction available and Patient being monitored Patient Re-evaluated:Patient Re-evaluated prior to induction Oxygen Delivery Method: Circle System Utilized Preoxygenation: Pre-oxygenation with 100% oxygen Induction Type: IV induction Ventilation: Mask ventilation without difficulty LMA: LMA inserted LMA Size: 4.0 Number of attempts: 1 Airway Equipment and Method: Bite block Placement Confirmation: positive ETCO2 Tube secured with: Tape Dental Injury: Teeth and Oropharynx as per pre-operative assessment

## 2022-06-23 NOTE — Op Note (Signed)
PREOPERATIVE DIAGNOSIS:RIGHT DISPLACED DISTAL RADIUS FRACTURE  POSTOPERATIVE DIAGNOSIS:RIGHT DISPLACED DISTAL RADIUS FRACTURE  ATTENDING SURGEON:DR. Sola Margolis WHO WAS SCRUBBBED AND PRESENT FOR THE ENTIRE PROCEDURE  ASSISTANT SURGEON: Gertie Fey, PA-C who is scrubbed and necessary for open reduction internal fixation closure and splinting in a timely fashion  ANESTHESIA: Regional with IV sedation  OPERATIVE PROCEDURE: Open reduction internal fixation of displaced right wrist intra-articular distal radius fracture 3 more fragments Right wrist brachioradialis tendon released, tendon tenotomy Radiographs 3 views right wrist  IMPLANTS: Biomet DVR cross lock  EBL: Minimal  RADIOGRAPHIC INTERPRETATION: AP lateral oblique views of the wrist do show the volar plate fixation place in good position  SURGICAL INDICATIONS: Patient is a right-hand-dominant gentleman who sustained a closed injury to her right wrist after fall.  Patient was seen evaluate the office and recommend undergo the above procedure.  The risks of surgery include but not limited to bleeding infection damage nearby nerves arteries or tendons nonunion malunion hardware failure loss of motion of the wrist and digits incomplete relief of symptoms and need for further surgical invention.  Signed informed consent was obtained on the day of surgery.  SURGICAL TECHNIQUE: The patient was prepped identified the preoperative holding area marked apart a marker made in the right wrist indicate correct operative site.  Patient brought back to operating placed supine on the anesthesia table where the regional anesthetic was administered.  Patient tolerated this well.  Well-padded tourniquet placed on the right brachium and stay with the appropriate drape.  The right upper extremities then prepped and draped normal sterile fashion.  A timeout was called the correct site was identified procedure then begun.  Attention then turned to the right  wrist a longitude incision made directly over the FCR tendon.  Dissection carried out through the skin and subcutaneous tissue.  The FCR sheath was then opened proximally distally.  Going through the floor the FCR sheath the FPL was then carefully swept out of the way and the pronator quadratus was then elevated in an L-shaped fashion.  Following this the fracture site was then exposed.  In order to reduce the radial column separate intervention was then done to release the brachial radialis tendon off the radial styloid.  Tendon tenotomy was then carried out to allow for reduction and visualization of the radial column.  Once this was carried out open reduction was then carried out.  The fracture site was then reduced.  It was held distally with a K wire with the volar plate in place.  Plate position was then confirmed using mini C arm.  After adequate plate position the oblong screw was then placed proximally.  Following this distal fixation was then carried out with a combination of distal locking pegs and screws.  Radiographs were then confirmed in all planes to confirm adequate placement of the distal fixation.  Following this fracture reduction was confirmed.  Proximal fixation was then carried out with combination of locking nonlocking screws within the shaft.  This was a greater than 3 part intra-articular fracture.  The wound was then thoroughly irrigated.  The pronator quadratus was then closed with 2-0 Vicryl.  Subcutaneous tissues closed with Monocryl and skin closed with Prolene suture.  Adaptic dressing sterile compressive bandage was applied.  The patient was then placed in a well-padded sugar-tong splint patient was then taken recovery in good condition.  POSTOPERATIVE PLAN: Patient be discharged to home.  See him back in the office in 10 to 14 days for  wound check suture removal x-rays application of short arm cast.  Cast for total 4 weeks.  Begin outpatient therapy regimen the 4-week mark.   Radiographs at each visit.

## 2022-06-23 NOTE — Addendum Note (Signed)
Addendum  created 06/23/22 1537 by Belinda Block, MD   Clinical Note Signed, Intraprocedure Blocks edited, Intraprocedure Meds edited, SmartForm saved

## 2022-07-08 DIAGNOSIS — S52561A Barton's fracture of right radius, initial encounter for closed fracture: Secondary | ICD-10-CM | POA: Diagnosis not present

## 2022-07-08 DIAGNOSIS — Z4789 Encounter for other orthopedic aftercare: Secondary | ICD-10-CM | POA: Diagnosis not present

## 2022-08-08 DIAGNOSIS — M25641 Stiffness of right hand, not elsewhere classified: Secondary | ICD-10-CM | POA: Diagnosis not present

## 2022-08-08 DIAGNOSIS — Z4789 Encounter for other orthopedic aftercare: Secondary | ICD-10-CM | POA: Diagnosis not present

## 2022-08-08 DIAGNOSIS — S52561A Barton's fracture of right radius, initial encounter for closed fracture: Secondary | ICD-10-CM | POA: Diagnosis not present

## 2022-08-14 DIAGNOSIS — M25641 Stiffness of right hand, not elsewhere classified: Secondary | ICD-10-CM | POA: Diagnosis not present

## 2022-08-28 DIAGNOSIS — M25641 Stiffness of right hand, not elsewhere classified: Secondary | ICD-10-CM | POA: Diagnosis not present

## 2023-03-20 ENCOUNTER — Ambulatory Visit (INDEPENDENT_AMBULATORY_CARE_PROVIDER_SITE_OTHER): Payer: Medicaid Other | Admitting: Family

## 2023-03-20 ENCOUNTER — Encounter: Payer: Self-pay | Admitting: Family

## 2023-03-20 VITALS — BP 128/76 | HR 80 | Temp 98.5°F | Ht 65.0 in | Wt 226.2 lb

## 2023-03-20 DIAGNOSIS — K219 Gastro-esophageal reflux disease without esophagitis: Secondary | ICD-10-CM | POA: Diagnosis not present

## 2023-03-20 DIAGNOSIS — M5431 Sciatica, right side: Secondary | ICD-10-CM

## 2023-03-20 MED ORDER — OMEPRAZOLE 20 MG PO CPDR: 20.0000 mg | DELAYED_RELEASE_CAPSULE | Freq: Every day | ORAL | 0 refills | Status: DC

## 2023-03-20 MED ORDER — DULOXETINE HCL 20 MG PO CPEP: 20.0000 mg | ORAL_CAPSULE | Freq: Every day | ORAL | 1 refills | Status: DC

## 2023-03-20 NOTE — Progress Notes (Signed)
Patient ID: Narita Urbach, female    DOB: 1986/04/04  MRN: [id number]  CC: Sciatic Pain  Subjective: [name] is a 37 y.o. female who presents for sciatic pain.   Her concerns today include:  - Right-sided sciatica began during pregnancy 2 years ago. Reports right-sided sciatica pain worsening over the last several months. She denies associated red flag symptoms. Over-the-counter medications providing minimal relief. She is not breastfeeding. - Needs refills of Omeprazole. - No further issues/concerns for discussion today.   Patient Active Problem List   Diagnosis Date Noted   Gestational hypertension w/o significant proteinuria in 3rd trimester 02/27/2021   Previous cesarean delivery affecting pregnancy 09/17/2020   Obesity in pregnancy, antepartum 09/17/2020   Monochorionic diamniotic twin gestation 09/17/2020   COVID-19 05/04/2019   Pelvic pain in female 11/25/2012   Tobacco use 04/22/2012     Current Outpatient Medications on File Prior to Visit  Medication Sig Dispense Refill   ibuprofen (ADVIL) 200 MG tablet Take 3 tablets (600 mg total) by mouth every 6 (six) hours as needed for cramping.     acetaminophen (TYLENOL) 325 MG tablet Take 2 tablets (650 mg total) by mouth every 6 (six) hours as needed for moderate pain or mild pain.     docusate sodium (COLACE) 100 MG capsule Take 1 capsule (100 mg total) by mouth 2 (two) times daily. 10 capsule 0   ferrous sulfate 325 (65 FE) MG tablet Take 1 tablet (325 mg total) by mouth every other day. (Patient not taking: Reported on 02/13/2022) 30 tablet 1   meloxicam (MOBIC) 15 MG tablet Take 1 tablet (15 mg total) by mouth daily. (Patient not taking: Reported on 06/16/2022) 90 tablet 1   oxyCODONE-acetaminophen (PERCOCET) 10-325 MG tablet Take 1 tablet by mouth every 6 (six) hours as needed for pain. 20 tablet 0   Prenatal Vit-Fe Fumarate-FA (M-NATAL PLUS) 27-1 MG TABS TAKE 1 TABLET BY MOUTH EVERY DAY (Patient not taking: Reported  on 02/13/2022) 30 tablet 3   No current facility-administered medications on file prior to visit.    Allergies  Allergen Reactions   Morphine And Codeine Shortness Of Breath    Social History   Socioeconomic History   Marital status: Single    Spouse name: Not on file   Number of children: Not on file   Years of education: Not on file   Highest education level: Associate degree: occupational, Scientist, product/process development, or vocational program  Occupational History   Not on file  Tobacco Use   Smoking status: Every Day    Current packs/day: 0.50    Average packs/day: 0.5 packs/day for 10.0 years (5.0 ttl pk-yrs)    Types: Cigarettes   Smokeless tobacco: Never  Vaping Use   Vaping status: Never Used  Substance and Sexual Activity   Alcohol use: Yes    Comment: occasional   Drug use: No   Sexual activity: Yes    Birth control/protection: None  Other Topics Concern   Not on file  Social History Narrative   Not on file   Social Determinants of Health   Financial Resource Strain: Low Risk  (03/16/2023)   Overall Financial Resource Strain (CARDIA)    Difficulty of Paying Living Expenses: Not hard at all  Food Insecurity: No Food Insecurity (03/16/2023)   Hunger Vital Sign    Worried About Running Out of Food in the Last Year: Never true    Ran Out of Food in the Last Year: Never true  Transportation  Needs: No Transportation Needs (03/16/2023)   PRAPARE - Administrator, Civil Service (Medical): No    Lack of Transportation (Non-Medical): No  Physical Activity: Unknown (03/16/2023)   Exercise Vital Sign    Days of Exercise per Week: 0 days    Minutes of Exercise per Session: Not on file  Stress: No Stress Concern Present (03/16/2023)   [employer] of Occupational Health - Occupational Stress Questionnaire    Feeling of Stress : Only a little  Social Connections: Moderately Integrated (03/16/2023)   Social Connection and Isolation Panel [NHANES]    Frequency of  Communication with Friends and Family: More than three times a week    Frequency of Social Gatherings with Friends and Family: Never    Attends Religious Services: 1 to 4 times per year    Active Member of [employer] or Organizations: No    Attends Engineer, structural: Not on file    Marital Status: Living with partner  Intimate Partner Violence: Unknown (11/27/2021)   Received from [employer], [facility]   HITS    Physically Hurt: Not on file    Insult or Talk Down To: Not on file    Threaten Physical Harm: Not on file    Scream or Curse: Not on file    Family History  Problem Relation Age of Onset   Hypertension Mother    Arthritis Mother    Diabetes Maternal Grandmother    Hypertension Maternal Grandmother    Diabetes Father     Past Surgical History:  Procedure Laterality Date   CESAREAN SECTION     CESAREAN SECTION MULTI-GESTATIONAL WITH TUBAL N/A 02/27/2021   Procedure: CESAREAN SECTION MULTI-GESTATIONAL WITH TUBAL;  Surgeon: Marlow Baars, MD;  Location: [facility] OR[facility];  Service: Obstetrics;  Laterality: N/A;   CHOLECYSTECTOMY     OPEN REDUCTION INTERNAL FIXATION (ORIF) DISTAL RADIAL FRACTURE Right 06/23/2022   Procedure: OPEN REDUCTION INTERNAL FIXATION (ORIF) DISTAL RADIUS FRACTURE, and repair as indicated;  Surgeon: Bradly Bienenstock, MD;  Location: [facility] OR;  Service: Orthopedics;  Laterality: Right;  90 min regional with iv sedation   TUBAL LIGATION  02/27/2021   Tubes removed    ROS: Review of Systems Negative except as stated above  PHYSICAL EXAM: BP 128/76   Pulse 80   Temp 98.5 F (36.9 C) (Oral)   Ht 5\' 5"  (1.651 m)   Wt 226 lb 3.2 oz (102.6 kg)   LMP 03/05/2023 (Approximate)   SpO2 93%   BMI 37.64 kg/m   Physical Exam HENT:     Head: Normocephalic and atraumatic.     Nose: Nose normal.     Mouth/Throat:     Mouth: Mucous membranes are moist.     Pharynx: Oropharynx is clear.  Eyes:     Extraocular Movements: Extraocular movements intact.      Conjunctiva/sclera: Conjunctivae normal.     Pupils: Pupils are equal, round, and reactive to light.  Cardiovascular:     Rate and Rhythm: Normal rate and regular rhythm.     Pulses: Normal pulses.     Heart sounds: Normal heart sounds.  Pulmonary:     Effort: Pulmonary effort is normal.     Breath sounds: Normal breath sounds.  Musculoskeletal:        General: Normal range of motion.     Right shoulder: Normal.     Left shoulder: Normal.     Right upper arm: Normal.     Left upper arm:  Normal.     Right elbow: Normal.     Left elbow: Normal.     Right forearm: Normal.     Left forearm: Normal.     Right wrist: Normal.     Left wrist: Normal.     Right hand: Normal.     Left hand: Normal.     Cervical back: Normal, normal range of motion and neck supple.     Thoracic back: Normal.     Lumbar back: Normal.     Right hip: Normal.     Left hip: Normal.     Right upper leg: Normal.     Left upper leg: Normal.     Right knee: Normal.     Left knee: Normal.     Right lower leg: Normal.     Left lower leg: Normal.     Right ankle: Normal.     Left ankle: Normal.     Right foot: Normal.     Left foot: Normal.  Neurological:     General: No focal deficit present.     Mental Status: She is alert and oriented to person, place, and time.  Psychiatric:        Mood and Affect: Mood normal.        Behavior: Behavior normal.     ASSESSMENT AND PLAN: 1. Sciatica of right side - Duloxetine as prescribed. Counseled on medication adherence/adverse effects.  - Follow-up with primary provider in 4 weeks or sooner if needed.  - DULoxetine (CYMBALTA) 20 MG capsule; Take 1 capsule (20 mg total) by mouth daily.  Dispense: 30 capsule; Refill: 1  2. Gastroesophageal reflux disease, unspecified whether esophagitis present - Continue Omeprazole as prescribed. Counseled on medication adherence/adverse effects.  - Follow-up with primary provider as scheduled.  - omeprazole (PRILOSEC) 20 MG  capsule; Take 1 capsule (20 mg total) by mouth daily.  Dispense: 90 capsule; Refill: 0   Patient was given the opportunity to ask questions.  Patient verbalized understanding of the plan and was able to repeat key elements of the plan. Patient was given clear instructions to go to Emergency Department or return to medical center if symptoms don't improve, worsen, or new problems develop.The patient verbalized understanding.  Requested Prescriptions   Signed Prescriptions Disp Refills   omeprazole (PRILOSEC) 20 MG capsule 90 capsule 0    Sig: Take 1 capsule (20 mg total) by mouth daily.   DULoxetine (CYMBALTA) 20 MG capsule 30 capsule 1    Sig: Take 1 capsule (20 mg total) by mouth daily.    Return in 4 weeks (on 04/17/2023) for Follow-Up or next available Georganna Skeans, MD .  Rema Fendt, NP

## 2023-03-20 NOTE — Progress Notes (Signed)
Pt asking for refill on omeprazole

## 2023-04-15 ENCOUNTER — Other Ambulatory Visit: Payer: Self-pay | Admitting: Family

## 2023-04-15 DIAGNOSIS — M5431 Sciatica, right side: Secondary | ICD-10-CM

## 2023-04-15 NOTE — Telephone Encounter (Signed)
 Complete. Schedule appointment.

## 2023-04-21 ENCOUNTER — Ambulatory Visit: Payer: Medicaid Other | Admitting: Family Medicine

## 2023-04-30 ENCOUNTER — Other Ambulatory Visit: Payer: Self-pay | Admitting: Family

## 2023-04-30 DIAGNOSIS — M5431 Sciatica, right side: Secondary | ICD-10-CM

## 2023-06-30 ENCOUNTER — Other Ambulatory Visit: Payer: Self-pay

## 2023-06-30 ENCOUNTER — Other Ambulatory Visit: Payer: Self-pay | Admitting: Family

## 2023-06-30 DIAGNOSIS — K219 Gastro-esophageal reflux disease without esophagitis: Secondary | ICD-10-CM

## 2023-06-30 MED ORDER — OMEPRAZOLE 20 MG PO CPDR: 20.0000 mg | DELAYED_RELEASE_CAPSULE | Freq: Every day | ORAL | 0 refills | Status: AC

## 2023-10-08 ENCOUNTER — Encounter: Payer: Medicaid Other | Admitting: Family Medicine

## 2023-12-24 ENCOUNTER — Telehealth: Admitting: Nurse Practitioner

## 2023-12-24 DIAGNOSIS — M545 Low back pain, unspecified: Secondary | ICD-10-CM

## 2023-12-24 NOTE — Progress Notes (Signed)
  Because we cannot prescribe medication that is any stronger than what you have already tried, and because your pain is radiating into your leg and you may require imaging, I feel your condition warrants further evaluation and I recommend that you be seen in a face-to-face visit.   NOTE: There will be NO CHARGE for this E-Visit   If you are having a true medical emergency, please call 911.     For an urgent face to face visit, [place] has multiple urgent care centers for your convenience.  Click the link below for the full list of locations and hours, walk-in wait times, appointment scheduling options and driving directions:  Urgent Care - [place], [place], [place], [place], [place], [place]  [place]     Your MyChart E-visit questionnaire answers were reviewed by a board certified advanced clinical practitioner to complete your personal care plan based on your specific symptoms.    Thank you for using e-Visits.

## 2023-12-25 ENCOUNTER — Emergency Department (HOSPITAL_BASED_OUTPATIENT_CLINIC_OR_DEPARTMENT_OTHER)
Admission: EM | Admit: 2023-12-25 | Discharge: 2023-12-25 | Disposition: A | Discharge status: Home / Self Care | Attending: Emergency Medicine | Admitting: Emergency Medicine

## 2023-12-25 ENCOUNTER — Emergency Department (HOSPITAL_BASED_OUTPATIENT_CLINIC_OR_DEPARTMENT_OTHER)

## 2023-12-25 ENCOUNTER — Encounter (HOSPITAL_BASED_OUTPATIENT_CLINIC_OR_DEPARTMENT_OTHER): Payer: Self-pay | Admitting: Emergency Medicine

## 2023-12-25 ENCOUNTER — Other Ambulatory Visit: Payer: Self-pay

## 2023-12-25 DIAGNOSIS — M62830 Muscle spasm of back: Secondary | ICD-10-CM | POA: Insufficient documentation

## 2023-12-25 DIAGNOSIS — M545 Low back pain, unspecified: Secondary | ICD-10-CM

## 2023-12-25 DIAGNOSIS — D72829 Elevated white blood cell count, unspecified: Secondary | ICD-10-CM | POA: Diagnosis not present

## 2023-12-25 LAB — BASIC METABOLIC PANEL WITH GFR
Anion gap: 13 (ref 5–15)
BUN: 12 mg/dL (ref 6–20)
CO2: 22 mmol/L (ref 22–32)
Calcium: 9.9 mg/dL (ref 8.9–10.3)
Chloride: 104 mmol/L (ref 98–111)
Creatinine, Ser: 0.97 mg/dL (ref 0.44–1.00)
GFR, Estimated: >60 mL/min (ref >60)
Glucose, Bld: 86 mg/dL (ref 70–99)
Potassium: 4.1 mmol/L (ref 3.5–5.1)
Sodium: 139 mmol/L (ref 135–145)

## 2023-12-25 LAB — CBC WITH DIFFERENTIAL/PLATELET
Abs Immature Granulocytes: 0.03 10*3/uL (ref 0.00–0.07)
Basophils Absolute: 0.1 10*3/uL (ref 0.0–0.1)
Basophils Relative: 0 %
Eosinophils Absolute: 0.2 10*3/uL (ref 0.0–0.5)
Eosinophils Relative: 2 %
HCT: 40.3 % (ref 36.0–46.0)
Hemoglobin: 13.3 g/dL (ref 12.0–15.0)
Immature Granulocytes: 0 %
Lymphocytes Relative: 29 %
Lymphs Abs: 3.6 10*3/uL (ref 0.7–4.0)
MCH: 27.8 pg (ref 26.0–34.0)
MCHC: 33 g/dL (ref 30.0–36.0)
MCV: 84.1 fL (ref 80.0–100.0)
Monocytes Absolute: 0.7 10*3/uL (ref 0.1–1.0)
Monocytes Relative: 6 %
Neutro Abs: 7.7 10*3/uL (ref 1.7–7.7)
Neutrophils Relative %: 63 %
Platelets: 412 10*3/uL — ABNORMAL HIGH (ref 150–400)
RBC: 4.79 MIL/uL (ref 3.87–5.11)
RDW: 13.6 % (ref 11.5–15.5)
WBC: 12.4 10*3/uL — ABNORMAL HIGH (ref 4.0–10.5)
nRBC: 0 % (ref 0.0–0.2)

## 2023-12-25 LAB — URINALYSIS, ROUTINE W REFLEX MICROSCOPIC
Bacteria, UA: NONE SEEN
Bilirubin Urine: NEGATIVE
Glucose, UA: NEGATIVE mg/dL
Ketones, ur: NEGATIVE mg/dL
Leukocytes,Ua: NEGATIVE
Nitrite: NEGATIVE
Protein, ur: NEGATIVE mg/dL
Specific Gravity, Urine: 1.02 (ref 1.005–1.030)
pH: 5.5 (ref 5.0–8.0)

## 2023-12-25 LAB — PREGNANCY, URINE: Preg Test, Ur: NEGATIVE

## 2023-12-25 MED ORDER — HYDROCODONE-ACETAMINOPHEN 5-325 MG PO TABS: 1.0000 | ORAL_TABLET | Freq: Four times a day (QID) | ORAL | 0 refills | Status: DC | PRN

## 2023-12-25 MED ORDER — NAPROXEN 500 MG PO TABS: 500.0000 mg | ORAL_TABLET | Freq: Two times a day (BID) | ORAL | 0 refills | Status: DC

## 2023-12-25 MED ORDER — KETOROLAC TROMETHAMINE 30 MG/ML IJ SOLN
30.0000 mg | Freq: Once | INTRAMUSCULAR | Status: AC
  Administered 2023-12-25: 30 mg via INTRAVENOUS
  Filled 2023-12-25: qty 1

## 2023-12-25 NOTE — ED Provider Notes (Signed)
 [place] EMERGENCY DEPARTMENT AT [facility] Provider Note   CSN: [id number] Arrival date & time: 12/25/23  1545     History  Chief Complaint  Patient presents with   Back Pain    [name] is a 38 y.o. female.  HPI   38 year old female past medical history of sciatica presents emergency department with right lower back pain.  Patient states a couple years ago when she was pregnant she had right sided sciatic pain.  However today she is presenting with back pain that is higher up and not radiating to her buttocks and down her leg.  She describes it as an aching/sharp pain in her right flank associated with some nausea.  She denies any specific genitourinary symptoms but endorses chills.  No history of kidney stones.    Home Medications Prior to Admission medications   Medication Sig Start Date End Date Taking? Authorizing Provider  acetaminophen  (TYLENOL ) 325 MG tablet Take 2 tablets (650 mg total) by mouth every 6 (six) hours as needed for moderate pain or mild pain. 03/02/21   Theodoro Fisherman, MD  docusate sodium  (COLACE) 100 MG capsule Take 1 capsule (100 mg total) by mouth 2 (two) times daily. 06/23/22   Arvil Birks, MD  DULoxetine  (CYMBALTA ) 20 MG capsule TAKE 1 CAPSULE BY MOUTH EVERY DAY 04/15/23   Senaida Dama, NP  ferrous sulfate  325 (65 FE) MG tablet Take 1 tablet (325 mg total) by mouth every other day. Patient not taking: Reported on 02/13/2022 01/23/21   Kooistra, Kathryn Lorraine, CNM  ibuprofen  (ADVIL ) 200 MG tablet Take 3 tablets (600 mg total) by mouth every 6 (six) hours as needed for cramping. 03/02/21   Marinone, Michelle E, MD  meloxicam  (MOBIC ) 15 MG tablet Take 1 tablet (15 mg total) by mouth daily. Patient not taking: Reported on 06/16/2022 02/13/22   Abraham Abo, MD  omeprazole  (PRILOSEC) 20 MG capsule TAKE 1 CAPSULE BY MOUTH EVERY DAY 06/30/23   Senaida Dama, NP  omeprazole  (PRILOSEC) 20 MG capsule Take 1 capsule (20 mg total) by mouth  daily. 06/30/23   Senaida Dama, NP  Prenatal Vit-Fe Fumarate-FA (M-NATAL PLUS) 27-1 MG TABS TAKE 1 TABLET BY MOUTH EVERY DAY Patient not taking: Reported on 02/13/2022 09/05/20   Julianne Octave, MD      Allergies    Morphine  and codeine    Review of Systems   Review of Systems  Constitutional:  Positive for chills and fatigue. Negative for fever.  Respiratory:  Negative for shortness of breath.   Cardiovascular:  Negative for chest pain.  Gastrointestinal:  Positive for nausea. Negative for abdominal pain, diarrhea and vomiting.  Genitourinary:  Positive for flank pain. Negative for difficulty urinating, dysuria, frequency and hematuria.  Musculoskeletal:  Positive for back pain.  Skin:  Negative for rash.  Neurological:  Negative for headaches.    Physical Exam Updated Vital Signs BP 109/74   Pulse 63   Temp 97.6 F (36.4 C) (Oral)   Resp 17   SpO2 98%  Physical Exam Vitals and nursing note reviewed.  Constitutional:      Appearance: Normal appearance.  HENT:     Head: Normocephalic.     Mouth/Throat:     Mouth: Mucous membranes are moist.  Cardiovascular:     Rate and Rhythm: Normal rate.  Pulmonary:     Effort: Pulmonary effort is normal. No respiratory distress.  Abdominal:     Palpations: Abdomen is soft.  Tenderness: There is no abdominal tenderness. There is right CVA tenderness.  Musculoskeletal:     Comments: Right paraspinal muscle tenderness  Skin:    General: Skin is warm.  Neurological:     Mental Status: She is alert and oriented to person, place, and time. Mental status is at baseline.  Psychiatric:        Mood and Affect: Mood normal.     ED Results / Procedures / Treatments   Labs (all labs ordered are listed, but only abnormal results are displayed) Labs Reviewed  CBC WITH DIFFERENTIAL/PLATELET - Abnormal; Notable for the following components:      Result Value   WBC 12.4 (*)    Platelets 412 (*)    All other components within  normal limits  BASIC METABOLIC PANEL WITH GFR  URINALYSIS, ROUTINE W REFLEX MICROSCOPIC  PREGNANCY, URINE    EKG None  Radiology No results found.  Procedures Procedures    Medications Ordered in ED Medications  ketorolac  (TORADOL ) 30 MG/ML injection 30 mg (30 mg Intravenous Given 12/25/23 1648)    ED Course/ Medical Decision Making/ A&P                                 Medical Decision Making Amount and/or Complexity of Data Reviewed Labs: ordered. Radiology: ordered.  Risk Prescription drug management.   38 year old female presents emergency department with right lower back/flank pain.  History of sciatica, denies history of kidney stones.  Mild nausea but no genitourinary symptoms.  Vital signs are normal and stable on arrival.  Blood work shows a mild leukocytosis, urine showed large amount of blood, however patient did just start her menstrual period.  CT renal study as well as recon of the lumbar spine did not show any acute findings.  On reevaluation after dose of Toradol  she feels improved.  Will treat her symptomatically for musculoskeletal lower back pain.  Patient at this time appears safe and stable for discharge and close outpatient follow up. Discharge plan and strict return to ED precautions discussed, patient verbalizes understanding and agreement.        Final Clinical Impression(s) / ED Diagnoses Final diagnoses:  None    Rx / DC Orders ED Discharge Orders     None         Flonnie Humphrey, DO 12/25/23 8295

## 2023-12-25 NOTE — ED Triage Notes (Signed)
 Pt with right lower back pain radiating down right leg.  Hx of sciatica in the past but states this feels "much worse"

## 2023-12-25 NOTE — Discharge Instructions (Signed)
 You have been seen and discharged from the emergency department.  The CT of your abdomen pelvis and back did not show any acute finding.  I believe you are suffering from musculoskeletal pain.  Take naproxen twice a day with food as prescribed.  Use stronger pain medicine as needed.  Do not mix this medication with alcohol or other sedating medications. Do not drive or do heavy physical activity until you know how this medication affects you.  It may cause drowsiness.  Follow-up with your primary provider for further evaluation and further care. Take home medications as prescribed. If you have any worsening symptoms or further concerns for your health please return to an emergency department for further evaluation.

## 2023-12-25 NOTE — ED Notes (Signed)
 Questions and concerns addressed. Discharge teaching completed.   Prescriptions reviewed and pharmacy verified.   Pt ambulatory upon discharge.

## 2024-01-13 ENCOUNTER — Other Ambulatory Visit: Payer: Self-pay | Admitting: Family

## 2024-01-13 DIAGNOSIS — K219 Gastro-esophageal reflux disease without esophagitis: Secondary | ICD-10-CM

## 2024-01-21 ENCOUNTER — Ambulatory Visit (INDEPENDENT_AMBULATORY_CARE_PROVIDER_SITE_OTHER): Admitting: Family Medicine

## 2024-01-21 ENCOUNTER — Encounter: Payer: Self-pay | Admitting: Family Medicine

## 2024-01-21 VITALS — BP 116/83 | HR 71 | Wt 226.6 lb

## 2024-01-21 DIAGNOSIS — Z1159 Encounter for screening for other viral diseases: Secondary | ICD-10-CM

## 2024-01-21 DIAGNOSIS — Z130 Encounter for screening for diseases of the blood and blood-forming organs and certain disorders involving the immune mechanism: Secondary | ICD-10-CM | POA: Diagnosis not present

## 2024-01-21 DIAGNOSIS — Z0000 Encounter for general adult medical examination without abnormal findings: Secondary | ICD-10-CM | POA: Diagnosis not present

## 2024-01-21 DIAGNOSIS — Z13220 Encounter for screening for lipoid disorders: Secondary | ICD-10-CM | POA: Diagnosis not present

## 2024-01-21 DIAGNOSIS — Z1329 Encounter for screening for other suspected endocrine disorder: Secondary | ICD-10-CM | POA: Diagnosis not present

## 2024-01-21 DIAGNOSIS — Z114 Encounter for screening for human immunodeficiency virus [HIV]: Secondary | ICD-10-CM

## 2024-01-21 DIAGNOSIS — K219 Gastro-esophageal reflux disease without esophagitis: Secondary | ICD-10-CM

## 2024-01-21 DIAGNOSIS — Z13228 Encounter for screening for other metabolic disorders: Secondary | ICD-10-CM

## 2024-01-21 MED ORDER — OMEPRAZOLE 20 MG PO CPDR
20.0000 mg | DELAYED_RELEASE_CAPSULE | Freq: Every day | ORAL | 1 refills | Status: AC
Start: 2024-01-21 — End: ?

## 2024-01-21 NOTE — Progress Notes (Signed)
 Established Patient Office Visit  Subjective    Patient ID: [name], female    DOB: Jun 12, 1986  Age: 38 y.o. MRN: [id number]  CC:  Chief Complaint  Patient presents with   Annual Exam    Blood work    Medication Refill    HPI [name] presents for routine annual exam. Patient denies acute complaints.   Outpatient Encounter Medications as of 01/21/2024  Medication Sig   [DISCONTINUED] omeprazole  (PRILOSEC) 20 MG capsule TAKE 1 CAPSULE BY MOUTH EVERY DAY   acetaminophen  (TYLENOL ) 325 MG tablet Take 2 tablets (650 mg total) by mouth every 6 (six) hours as needed for moderate pain or mild pain.   docusate sodium  (COLACE) 100 MG capsule Take 1 capsule (100 mg total) by mouth 2 (two) times daily.   DULoxetine  (CYMBALTA ) 20 MG capsule TAKE 1 CAPSULE BY MOUTH EVERY DAY (Patient not taking: Reported on 01/21/2024)   ferrous sulfate  325 (65 FE) MG tablet Take 1 tablet (325 mg total) by mouth every other day. (Patient not taking: Reported on 02/13/2022)   HYDROcodone -acetaminophen  (NORCO/VICODIN) 5-325 MG tablet Take 1 tablet by mouth every 6 (six) hours as needed. (Patient not taking: Reported on 01/21/2024)   ibuprofen  (ADVIL ) 200 MG tablet Take 3 tablets (600 mg total) by mouth every 6 (six) hours as needed for cramping. (Patient not taking: Reported on 01/21/2024)   naproxen  (NAPROSYN ) 500 MG tablet Take 1 tablet (500 mg total) by mouth 2 (two) times daily. (Patient not taking: Reported on 01/21/2024)   omeprazole  (PRILOSEC) 20 MG capsule Take 1 capsule (20 mg total) by mouth daily. (Patient not taking: Reported on 01/21/2024)   omeprazole  (PRILOSEC) 20 MG capsule Take 1 capsule (20 mg total) by mouth daily.   Prenatal Vit-Fe Fumarate-FA (M-NATAL PLUS) 27-1 MG TABS TAKE 1 TABLET BY MOUTH EVERY DAY (Patient not taking: Reported on 02/13/2022)   No facility-administered encounter medications on file as of 01/21/2024.    Past Medical History:  Diagnosis Date   GERD (gastroesophageal  reflux disease) 2008   H pylori ulcer    H. pylori infection     Past Surgical History:  Procedure Laterality Date   CESAREAN SECTION     CESAREAN SECTION MULTI-GESTATIONAL WITH TUBAL N/A 02/27/2021   Procedure: CESAREAN SECTION MULTI-GESTATIONAL WITH TUBAL;  Surgeon: Luan Rumpf, MD;  Location: [facility];  Service: Obstetrics;  Laterality: N/A;   CHOLECYSTECTOMY     OPEN REDUCTION INTERNAL FIXATION (ORIF) DISTAL RADIAL FRACTURE Right 06/23/2022   Procedure: OPEN REDUCTION INTERNAL FIXATION (ORIF) DISTAL RADIUS FRACTURE, and repair as indicated;  Surgeon: Arvil Birks, MD;  Location: [facility] OR;  Service: Orthopedics;  Laterality: Right;  90 min regional with iv sedation   TUBAL LIGATION  02/27/2021   Tubes removed    Family History  Problem Relation Age of Onset   Hypertension Mother    Arthritis Mother    Diabetes Maternal Grandmother    Hypertension Maternal Grandmother    Diabetes Father     Social History   Socioeconomic History   Marital status: Single    Spouse name: Not on file   Number of children: Not on file   Years of education: Not on file   Highest education level: Associate degree: occupational, Scientist, product/process development, or vocational program  Occupational History   Not on file  Tobacco Use   Smoking status: Every Day    Current packs/day: 0.50    Average packs/day: 0.5 packs/day for 10.0 years (5.0 ttl pk-yrs)  Types: Cigarettes   Smokeless tobacco: Never  Vaping Use   Vaping status: Never Used  Substance and Sexual Activity   Alcohol use: Yes    Comment: occasional   Drug use: No   Sexual activity: Yes    Birth control/protection: None  Other Topics Concern   Not on file  Social History Narrative   Not on file   Social Drivers of Health   Financial Resource Strain: Low Risk  (03/16/2023)   Overall Financial Resource Strain (CARDIA)    Difficulty of Paying Living Expenses: Not hard at all  Food Insecurity: No Food Insecurity (03/16/2023)   Hunger Vital Sign     Worried About Running Out of Food in the Last Year: Never true    Ran Out of Food in the Last Year: Never true  Transportation Needs: No Transportation Needs (03/16/2023)   PRAPARE - Administrator, Civil Service (Medical): No    Lack of Transportation (Non-Medical): No  Physical Activity: Unknown (03/16/2023)   Exercise Vital Sign    Days of Exercise per Week: 0 days    Minutes of Exercise per Session: Not on file  Stress: No Stress Concern Present (03/16/2023)   [employer] of Occupational Health - Occupational Stress Questionnaire    Feeling of Stress : Only a little  Social Connections: Moderately Integrated (03/16/2023)   Social Connection and Isolation Panel [NHANES]    Frequency of Communication with Friends and Family: More than three times a week    Frequency of Social Gatherings with Friends and Family: Never    Attends Religious Services: 1 to 4 times per year    Active Member of [employer] or Organizations: No    Attends Engineer, structural: Not on file    Marital Status: Living with partner  Intimate Partner Violence: Unknown (11/27/2021)   Received from [employer], [facility]   HITS    Physically Hurt: Not on file    Insult or Talk Down To: Not on file    Threaten Physical Harm: Not on file    Scream or Curse: Not on file    Review of Systems  All other systems reviewed and are negative.       Objective    BP 116/83 (BP Location: Right Arm, Patient Position: Sitting, Cuff Size: Large)   Pulse 71   Wt 226 lb 9.6 oz (102.8 kg)   SpO2 95%   Breastfeeding No   BMI 37.71 kg/m   Physical Exam Vitals and nursing note reviewed.  Constitutional:      General: She is not in acute distress.    Appearance: She is obese.  HENT:     Head: Normocephalic and atraumatic.     Right Ear: Tympanic membrane, ear canal and external ear normal.     Left Ear: Tympanic membrane, ear canal and external ear normal.     Nose: Nose normal.      Mouth/Throat:     Mouth: Mucous membranes are moist.     Pharynx: Oropharynx is clear.  Eyes:     Conjunctiva/sclera: Conjunctivae normal.     Pupils: Pupils are equal, round, and reactive to light.  Neck:     Thyroid: No thyromegaly.  Cardiovascular:     Rate and Rhythm: Normal rate and regular rhythm.     Heart sounds: Normal heart sounds. No murmur heard. Pulmonary:     Effort: Pulmonary effort is normal. No respiratory distress.     Breath sounds:  Normal breath sounds.  Abdominal:     General: There is no distension.     Palpations: Abdomen is soft. There is no mass.     Tenderness: There is no abdominal tenderness.  Musculoskeletal:        General: Normal range of motion.     Cervical back: Normal range of motion and neck supple.  Skin:    General: Skin is warm and dry.  Neurological:     General: No focal deficit present.     Mental Status: She is alert and oriented to person, place, and time.  Psychiatric:        Mood and Affect: Mood normal.        Behavior: Behavior normal.         Assessment & Plan:   Annual physical exam -     CMP14+EGFR  Screening for deficiency anemia -     CBC with Differential/Platelet  Screening for lipid disorders -     Lipid panel  Screening for endocrine/metabolic/immunity disorders -     Hemoglobin A1c -     TSH  Gastroesophageal reflux disease, unspecified whether esophagitis present -     Omeprazole ; Take 1 capsule (20 mg total) by mouth daily.  Dispense: 90 capsule; Refill: 1  Screening for HIV (human immunodeficiency virus) -     HIV Antibody (routine testing w rflx)  Need for hepatitis C screening test -     Hepatitis C antibody     No follow-ups on file.   Arlo Lama, MD

## 2024-01-22 ENCOUNTER — Ambulatory Visit: Payer: Self-pay | Admitting: Family Medicine

## 2024-01-22 LAB — CMP14+EGFR
ALT: 15 IU/L (ref 0–32)
AST: 13 IU/L (ref 0–40)
Albumin: 4.6 g/dL (ref 3.9–4.9)
Alkaline Phosphatase: 67 IU/L (ref 44–121)
BUN/Creatinine Ratio: 12 (ref 9–23)
BUN: 11 mg/dL (ref 6–20)
Bilirubin Total: 0.2 mg/dL (ref 0.0–1.2)
CO2: 18 mmol/L — ABNORMAL LOW (ref 20–29)
Calcium: 9.5 mg/dL (ref 8.7–10.2)
Chloride: 105 mmol/L (ref 96–106)
Creatinine, Ser: 0.95 mg/dL (ref 0.57–1.00)
Globulin, Total: 3 g/dL (ref 1.5–4.5)
Glucose: 87 mg/dL (ref 70–99)
Potassium: 5.1 mmol/L (ref 3.5–5.2)
Sodium: 140 mmol/L (ref 134–144)
Total Protein: 7.6 g/dL (ref 6.0–8.5)
eGFR: 79 mL/min/{1.73_m2} (ref >59)

## 2024-01-22 LAB — CBC WITH DIFFERENTIAL/PLATELET
Basophils Absolute: 0.1 10*3/uL (ref 0.0–0.2)
Basos: 0 %
EOS (ABSOLUTE): 0.3 10*3/uL (ref 0.0–0.4)
Eos: 2 %
Hematocrit: 40.9 % (ref 34.0–46.6)
Hemoglobin: 13.2 g/dL (ref 11.1–15.9)
Immature Grans (Abs): 0 10*3/uL (ref 0.0–0.1)
Immature Granulocytes: 0 %
Lymphocytes Absolute: 2.9 10*3/uL (ref 0.7–3.1)
Lymphs: 26 %
MCH: 27.6 pg (ref 26.6–33.0)
MCHC: 32.3 g/dL (ref 31.5–35.7)
MCV: 85 fL (ref 79–97)
Monocytes Absolute: 0.6 10*3/uL (ref 0.1–0.9)
Monocytes: 5 %
Neutrophils Absolute: 7.4 10*3/uL — ABNORMAL HIGH (ref 1.4–7.0)
Neutrophils: 67 %
Platelets: 432 10*3/uL (ref 150–450)
RBC: 4.79 x10E6/uL (ref 3.77–5.28)
RDW: 13 % (ref 11.7–15.4)
WBC: 11.2 10*3/uL — ABNORMAL HIGH (ref 3.4–10.8)

## 2024-01-22 LAB — LIPID PANEL
Chol/HDL Ratio: 4.9 ratio — ABNORMAL HIGH (ref 0.0–4.4)
Cholesterol, Total: 191 mg/dL (ref 100–199)
HDL: 39 mg/dL — ABNORMAL LOW (ref >39)
LDL Chol Calc (NIH): 133 mg/dL — ABNORMAL HIGH (ref 0–99)
Triglycerides: 105 mg/dL (ref 0–149)
VLDL Cholesterol Cal: 19 mg/dL (ref 5–40)

## 2024-01-22 LAB — HEMOGLOBIN A1C
Est. average glucose Bld gHb Est-mCnc: 114 mg/dL
Hgb A1c MFr Bld: 5.6 % (ref 4.8–5.6)

## 2024-01-22 LAB — HEPATITIS C ANTIBODY: Hep C Virus Ab: NONREACTIVE

## 2024-01-22 LAB — HIV ANTIBODY (ROUTINE TESTING W REFLEX): HIV Screen 4th Generation wRfx: NONREACTIVE

## 2024-01-22 LAB — TSH: TSH: 1.66 u[IU]/mL (ref 0.450–4.500)

## 2024-05-02 ENCOUNTER — Encounter: Payer: Self-pay | Admitting: Family Medicine

## 2024-05-03 ENCOUNTER — Ambulatory Visit (INDEPENDENT_AMBULATORY_CARE_PROVIDER_SITE_OTHER): Admitting: Family Medicine

## 2024-05-03 ENCOUNTER — Encounter: Payer: Self-pay | Admitting: Family Medicine

## 2024-05-03 VITALS — BP 113/77 | HR 78 | Ht 65.0 in | Wt 221.4 lb

## 2024-05-03 DIAGNOSIS — R21 Rash and other nonspecific skin eruption: Secondary | ICD-10-CM

## 2024-05-03 MED ORDER — NYSTATIN 100000 UNIT/GM EX CREA: 1.0000 | TOPICAL_CREAM | Freq: Two times a day (BID) | CUTANEOUS | 0 refills | Status: DC

## 2024-05-04 ENCOUNTER — Encounter: Payer: Self-pay | Admitting: Family Medicine

## 2024-05-04 NOTE — Progress Notes (Signed)
 Established Patient Office Visit  Subjective    Patient ID: [name], female    DOB: [birth year]  Age: 38 y.o. MRN: [id number]  CC:  Chief Complaint  Patient presents with   white patch on face    Pt has white spot on her face. Reports it appeared on 8/19 and has grown in diameter since then. Sometimes itchy, sometimes feels a burning sensation.     HPI [name] presents with complaint of whitish lesion in right temporal area that has been there for about a month. It is intermittently itchy. Patient denies known contacts or exposures.   Outpatient Encounter Medications as of 05/03/2024  Medication Sig   nystatin  cream (MYCOSTATIN ) Apply 1 Application topically 2 (two) times daily.   omeprazole  (PRILOSEC) 20 MG capsule Take 1 capsule (20 mg total) by mouth daily.   DULoxetine  (CYMBALTA ) 20 MG capsule TAKE 1 CAPSULE BY MOUTH EVERY DAY (Patient not taking: Reported on 01/21/2024)   HYDROcodone -acetaminophen  (NORCO/VICODIN) 5-325 MG tablet Take 1 tablet by mouth every 6 (six) hours as needed. (Patient not taking: Reported on 01/21/2024)   ibuprofen  (ADVIL ) 200 MG tablet Take 3 tablets (600 mg total) by mouth every 6 (six) hours as needed for cramping. (Patient not taking: Reported on 01/21/2024)   naproxen  (NAPROSYN ) 500 MG tablet Take 1 tablet (500 mg total) by mouth 2 (two) times daily. (Patient not taking: Reported on 01/21/2024)   omeprazole  (PRILOSEC) 20 MG capsule Take 1 capsule (20 mg total) by mouth daily. (Patient not taking: Reported on 01/21/2024)   No facility-administered encounter medications on file as of 05/03/2024.    Past Medical History:  Diagnosis Date   GERD (gastroesophageal reflux disease) 2008   H pylori ulcer    H. pylori infection     Past Surgical History:  Procedure Laterality Date   CESAREAN SECTION     CESAREAN SECTION MULTI-GESTATIONAL WITH TUBAL N/A 02/27/2021   Procedure: CESAREAN SECTION MULTI-GESTATIONAL WITH TUBAL;  Surgeon: Gretta Gums,  MD;  Location: [facility];  Service: Obstetrics;  Laterality: N/A;   CHOLECYSTECTOMY     OPEN REDUCTION INTERNAL FIXATION (ORIF) DISTAL RADIAL FRACTURE Right 06/23/2022   Procedure: OPEN REDUCTION INTERNAL FIXATION (ORIF) DISTAL RADIUS FRACTURE, and repair as indicated;  Surgeon: Shari Easter, MD;  Location: [facility] OR;  Service: Orthopedics;  Laterality: Right;  90 min regional with iv sedation   TUBAL LIGATION  02/27/2021   Tubes removed    Family History  Problem Relation Age of Onset   Hypertension Mother    Arthritis Mother    Diabetes Maternal Grandmother    Hypertension Maternal Grandmother    Diabetes Father     Social History   Socioeconomic History   Marital status: Single    Spouse name: Not on file   Number of children: Not on file   Years of education: Not on file   Highest education level: Associate degree: occupational, Scientist, product/process development, or vocational program  Occupational History   Not on file  Tobacco Use   Smoking status: Every Day    Current packs/day: 0.50    Average packs/day: 0.5 packs/day for 10.0 years (5.0 ttl pk-yrs)    Types: Cigarettes   Smokeless tobacco: Never  Vaping Use   Vaping status: Never Used  Substance and Sexual Activity   Alcohol use: Yes    Comment: occasional   Drug use: No   Sexual activity: Yes    Birth control/protection: None  Other Topics Concern   Not on  file  Social History Narrative   Not on file   Social Drivers of Health   Financial Resource Strain: Low Risk  (05/02/2024)   Overall Financial Resource Strain (CARDIA)    Difficulty of Paying Living Expenses: Not hard at all  Food Insecurity: No Food Insecurity (05/02/2024)   Hunger Vital Sign    Worried About Running Out of Food in the Last Year: Never true    Ran Out of Food in the Last Year: Never true  Transportation Needs: No Transportation Needs (05/02/2024)   PRAPARE - Administrator, Civil Service (Medical): No    Lack of Transportation (Non-Medical): No   Physical Activity: Inactive (05/02/2024)   Exercise Vital Sign    Days of Exercise per Week: 0 days    Minutes of Exercise per Session: Not on file  Stress: No Stress Concern Present (05/02/2024)   [employer] of Occupational Health - Occupational Stress Questionnaire    Feeling of Stress: Not at all  Social Connections: Moderately Integrated (05/02/2024)   Social Connection and Isolation Panel    Frequency of Communication with Friends and Family: More than three times a week    Frequency of Social Gatherings with Friends and Family: Once a week    Attends Religious Services: 1 to 4 times per year    Active Member of [employer] or Organizations: No    Attends Engineer, structural: Not on file    Marital Status: Living with partner  Intimate Partner Violence: Unknown (11/27/2021)   Received from [facility]   HITS    Physically Hurt: Not on file    Insult or Talk Down To: Not on file    Threaten Physical Harm: Not on file    Scream or Curse: Not on file    Review of Systems  Skin:  Positive for rash.  All other systems reviewed and are negative.       Objective    BP 113/77   Pulse 78   Ht 5' 5 (1.651 m)   Wt 221 lb 6.4 oz (100.4 kg)   SpO2 94%   BMI 36.84 kg/m   Physical Exam Vitals and nursing note reviewed.  Constitutional:      General: She is not in acute distress. Cardiovascular:     Rate and Rhythm: Normal rate and regular rhythm.  Pulmonary:     Effort: Pulmonary effort is normal.     Breath sounds: Normal breath sounds.  Skin:    Comments: A solitary hypopigmented circular nickel size lesion on her right temoral area with some minimal scaling.   Neurological:     General: No focal deficit present.     Mental Status: She is alert and oriented to person, place, and time.         Assessment & Plan:   Rash and nonspecific skin eruption  Other orders -     Nystatin ; Apply 1 Application topically 2 (two) times daily.  Dispense: 30 g;  Refill: 0   Appears clinically to be possible tinea. Nystatin  prescribed.   Return if symptoms worsen or fail to improve.   Tanda Raguel SQUIBB, MD

## 2024-07-27 ENCOUNTER — Encounter: Payer: Self-pay | Admitting: Family Medicine

## 2024-08-08 ENCOUNTER — Other Ambulatory Visit: Payer: Self-pay | Admitting: Family Medicine

## 2024-08-08 DIAGNOSIS — K219 Gastro-esophageal reflux disease without esophagitis: Secondary | ICD-10-CM

## 2024-09-12 ENCOUNTER — Telehealth: Admitting: Physician Assistant

## 2024-09-12 DIAGNOSIS — R112 Nausea with vomiting, unspecified: Secondary | ICD-10-CM

## 2024-09-12 MED ORDER — ONDANSETRON 4 MG PO TBDP: 4.0000 mg | ORAL_TABLET | Freq: Three times a day (TID) | ORAL | 0 refills | Status: AC | PRN

## 2024-09-12 NOTE — Progress Notes (Signed)
 We are sorry that you are not feeling well. Here is how we plan to help!  Based on what you have shared with me it looks like you have a Virus that is irritating your GI tract.  Vomiting is the forceful emptying of a portion of the stomach's content through the mouth.  Although nausea and vomiting can make you feel miserable, it's important to remember that these are not diseases, but rather symptoms of an underlying illness.  When we treat short term symptoms, we always caution that any symptoms that persist should be fully evaluated in a medical office.  I have prescribed a medication that will help alleviate your symptoms and allow you to stay hydrated:  Zofran  4 mg 1 tablet every 8 hours as needed for nausea and vomiting  HOME CARE: Drink clear liquids.  This is very important! Dehydration (the lack of fluid) can lead to a serious complication.  Start off with 1 tablespoon every 5 minutes for 8 hours. You may begin eating bland foods after 8 hours without vomiting.  Start with saltine crackers, white bread, rice, mashed potatoes, applesauce. After 48 hours on a bland diet, you may resume a normal diet. Try to go to sleep.  Sleep often empties the stomach and relieves the need to vomit.  GET HELP RIGHT AWAY IF:  Your symptoms do not improve or worsen within 2 days after treatment. You have a fever for over 3 days. You cannot keep down fluids after trying the medication.  MAKE SURE YOU:  Understand these instructions. Will watch your condition. Will get help right away if you are not doing well or get worse.   Thank you for choosing an e-visit. Your e-visit answers were reviewed by a board certified advanced clinical practitioner to complete your personal care plan. Depending upon the condition, your plan could have included both over the counter or prescription medications. Please review your pharmacy choice. Be sure that the pharmacy you have chosen is open so that you can pick up  your prescription now.  If there is a problem you may message your provider in MyChart to have the prescription routed to another pharmacy. Your safety is important to us . If you have drug allergies check your prescription carefully.  For the next 24 hours, you can use MyChart to ask questions about today's visit, request a non-urgent call back, or ask for a work or school excuse from your e-visit provider. You will get an e-mail in the next two days asking about your experience. I hope that your e-visit has been valuable and will speed your recovery.  I have spent 5 minutes in review of e-visit questionnaire, review and updating patient chart, medical decision making and response to patient.   Teena Shuck, PA-C

## 2024-09-24 ENCOUNTER — Telehealth: Admitting: Family Medicine

## 2024-09-24 DIAGNOSIS — R21 Rash and other nonspecific skin eruption: Secondary | ICD-10-CM | POA: Diagnosis not present

## 2024-09-24 NOTE — Progress Notes (Signed)
 " Virtual Visit Consent   [name], you are scheduled for a virtual visit with a [facility] provider today. Just as with appointments in the office, your consent must be obtained to participate. Your consent will be active for this visit and any virtual visit you may have with one of our providers in the next 365 days. If you have a MyChart account, a copy of this consent can be sent to you electronically.  As this is a virtual visit, video technology does not allow for your provider to perform a traditional examination. This may limit your provider's ability to fully assess your condition. If your provider identifies any concerns that need to be evaluated in person or the need to arrange testing (such as labs, EKG, etc.), we will make arrangements to do so. Although advances in technology are sophisticated, we cannot ensure that it will always work on either your end or our end. If the connection with a video visit is poor, the visit may have to be switched to a telephone visit. With either a video or telephone visit, we are not always able to ensure that we have a secure connection.  By engaging in this virtual visit, you consent to the provision of healthcare and authorize for your insurance to be billed (if applicable) for the services provided during this visit. Depending on your insurance coverage, you may receive a charge related to this service.  I need to obtain your verbal consent now. Are you willing to proceed with your visit today? [name] has provided verbal consent on 09/24/2024 for a virtual visit (video or telephone). Loa Lamp, FNP  Date: 09/24/2024 8:50 AM   Virtual Visit via Video Note   I, Loa Lamp, connected with  [name]  ([id number], 1986-08-12) on 09/24/24 at  8:45 AM EST by a video-enabled telemedicine application and verified that I am speaking with the correct person using two identifiers.  Location: Patient: Virtual Visit Location Patient:  Home Provider: Virtual Visit Location Provider: Home Office   I discussed the limitations of evaluation and management by telemedicine and the availability of in person appointments. The patient expressed understanding and agreed to proceed.    History of Present Illness: [name] is a 39 y.o. who identifies as a female who was assigned female at birth, and is being seen today for patch on left buttocks like the one on her face a few months ago. Loss of pigmentation and burning. Approx the size of a quarter. She has nystatin  that cleared it before but wants to make sure it is ok to use it. SABRA  HPI: HPI  Problems:  Patient Active Problem List   Diagnosis Date Noted   Gestational hypertension w/o significant proteinuria in 3rd trimester 02/27/2021   Previous cesarean delivery affecting pregnancy 09/17/2020   Obesity in pregnancy, antepartum 09/17/2020   Monochorionic diamniotic twin gestation 09/17/2020   COVID-19 05/04/2019   Pelvic pain in female 11/25/2012   Tobacco use 04/22/2012    Allergies: Allergies[1] Medications: Current Medications[2]  Observations/Objective: Patient is well-developed, well-nourished in no acute distress.  Resting comfortably  at home.  Head is normocephalic, atraumatic.  No labored breathing.  Speech is clear and coherent with logical content.  Patient is alert and oriented at baseline.    Assessment and Plan: 1. Rash and nonspecific skin eruption (Primary)  Use nystatin  she has, follow up with pcp as needed.   Follow Up Instructions: I discussed the assessment and treatment plan with the  patient. The patient was provided an opportunity to ask questions and all were answered. The patient agreed with the plan and demonstrated an understanding of the instructions.  A copy of instructions were sent to the patient via MyChart unless otherwise noted below.     The patient was advised to call back or seek an in-person evaluation if the symptoms  worsen or if the condition fails to improve as anticipated.    Lecia Esperanza, FNP     [1]  Allergies Allergen Reactions   Morphine  And Codeine Shortness Of Breath  [2]  Current Outpatient Medications:    omeprazole  (PRILOSEC) 20 MG capsule, Take 1 capsule (20 mg total) by mouth daily. (Patient not taking: Reported on 01/21/2024), Disp: 90 capsule, Rfl: 0   omeprazole  (PRILOSEC) 20 MG capsule, TAKE 1 CAPSULE BY MOUTH EVERY DAY, Disp: 90 capsule, Rfl: 1   ondansetron  (ZOFRAN -ODT) 4 MG disintegrating tablet, Take 1 tablet (4 mg total) by mouth every 8 (eight) hours as needed for nausea or vomiting., Disp: 20 tablet, Rfl: 0  "

## 2024-09-24 NOTE — Patient Instructions (Signed)
 Rash, Adult A rash is a breakout of spots or blotches on the skin. It can affect the way the skin looks and feels. Many things can cause a rash. Common causes include: Viral infections. These include colds, measles, and hand, foot, and mouth disease. Bacterial infections. These include scarlet fever and impetigo. Fungal infections. These include athlete's foot, ringworm, and yeast rashes. Skin irritation. This may be from heat rash, exposure to moisture or friction for a long time (intertrigo), or exposure to soap or skin care products (eczema). Allergic reactions. These may be caused by foods, medicines, or things like poison ivy. Some rashes may go away after a few days. Others may last for a few weeks. The goal of treatment is to stop the itching and keep the rash from spreading. Follow these instructions at home: Medicine Take or apply over-the-counter and prescription medicines only as told by your health care provider. These may include: Corticosteroids. These can help treat red or swollen skin. They may be given as creams or as medicines to take by mouth (orally). Anti-itch lotions. Allergy medicines. Pain medicine. Antifungal medicine if the rash is from a fungal infection. Antibiotics if you have an infection.  Skin care Apply cool, wet cloths (compresses) to the affected areas. Do not scratch or rub your skin. Avoid covering the rash. Keep it exposed to air as often as you can. Managing itching and discomfort Avoid hot showers and baths. These can make itching worse. A cold shower may help. Try taking a bath with: Epsom salts. You can get these at your local pharmacy or grocery store. Follow the instructions on the package. Baking soda. Pour a small amount into the bath as told by your provider. Colloidal oatmeal. You can get this at your local pharmacy or grocery store. Follow the instructions on the package. Try putting baking soda paste on your skin. Stir water into baking  soda until it becomes like a paste. Try using calamine lotion or cortisone cream to help with itchiness. Keep cool. Stay out of the sun. Sweating and being hot can make itching worse. General instructions  Rest as needed. Drink enough fluid to keep your pee (urine) pale yellow. Wear loose-fitting clothes. Avoid scented soaps, detergents, and perfumes. Use gentle soaps, detergents, perfumes, and cosmetics. Avoid the things that cause your rash (triggers). Keep a journal to help keep track of your triggers. Write down: What you eat. What cosmetics you use. What you drink. What you wear. This includes jewelry. Contact a health care provider if: You sweat at night more than normal. You pee (urinate) more or less than normal, or your pee is a darker color than normal. Your eyes become sensitive to light. Your skin or the white parts of your eyes turn yellow (jaundice). Your skin tingles or is numb. You get painful blisters in your nose or mouth. Your rash does not go away after a few days, or it gets worse. You are more tired or thirsty than normal. You have new or worse symptoms. These may include: Pain in your abdomen. Fever. Diarrhea or vomiting. Weakness or weight loss. Get help right away if: You get confused. You have a severe headache, a stiff neck, or severe joint pain or stiffness. You become very sleepy or not responsive. You have a seizure. This information is not intended to replace advice given to you by your health care provider. Make sure you discuss any questions you have with your health care provider. Document Revised: 05/30/2022 Document Reviewed:  05/30/2022 [employer] Patient Education  2024 [employer].
